# Patient Record
Sex: Female | Born: 1985 | Race: Black or African American | Hispanic: No | Marital: Single | Smoking: Never smoker
Health system: Southern US, Community
[De-identification: ages and names within clinical notes are randomized; demographics above are authoritative.]

## PROBLEM LIST (undated history)

## (undated) DIAGNOSIS — J45909 Unspecified asthma, uncomplicated: Secondary | ICD-10-CM

## (undated) DIAGNOSIS — J339 Nasal polyp, unspecified: Secondary | ICD-10-CM

## (undated) DIAGNOSIS — Z886 Allergy status to analgesic agent status: Secondary | ICD-10-CM

## (undated) DIAGNOSIS — N200 Calculus of kidney: Secondary | ICD-10-CM

## (undated) DIAGNOSIS — O039 Complete or unspecified spontaneous abortion without complication: Secondary | ICD-10-CM

## (undated) HISTORY — DX: Unspecified asthma, uncomplicated: J45.909

## (undated) HISTORY — DX: Allergy status to analgesic agent: Z88.6

## (undated) HISTORY — DX: Nasal polyp, unspecified: J33.9

## (undated) HISTORY — DX: Calculus of kidney: N20.0

## (undated) HISTORY — PX: NASAL ENDOSCOPY: SHX286

## (undated) HISTORY — DX: Complete or unspecified spontaneous abortion without complication: O03.9

## (undated) HISTORY — PX: NASAL SINUS SURGERY: SHX719

---

## 2017-07-26 DIAGNOSIS — Z888 Allergy status to other drugs, medicaments and biological substances status: Secondary | ICD-10-CM | POA: Insufficient documentation

## 2017-07-26 DIAGNOSIS — Z886 Allergy status to analgesic agent status: Secondary | ICD-10-CM | POA: Insufficient documentation

## 2017-12-11 ENCOUNTER — Encounter: Payer: Self-pay | Admitting: Emergency Medicine

## 2017-12-11 ENCOUNTER — Emergency Department: Payer: Managed Care, Other (non HMO)

## 2017-12-11 ENCOUNTER — Emergency Department
Admission: EM | Admit: 2017-12-11 | Discharge: 2017-12-12 | Disposition: A | Discharge status: Home / Self Care | Payer: Managed Care, Other (non HMO) | Attending: Emergency Medicine | Admitting: Emergency Medicine

## 2017-12-11 DIAGNOSIS — R200 Anesthesia of skin: Secondary | ICD-10-CM | POA: Diagnosis present

## 2017-12-11 DIAGNOSIS — G510 Bell's palsy: Secondary | ICD-10-CM | POA: Diagnosis not present

## 2017-12-11 DIAGNOSIS — Z79899 Other long term (current) drug therapy: Secondary | ICD-10-CM | POA: Insufficient documentation

## 2017-12-11 DIAGNOSIS — R29810 Facial weakness: Secondary | ICD-10-CM

## 2017-12-11 LAB — COMPREHENSIVE METABOLIC PANEL
ALK PHOS: 50 U/L (ref 38–126)
ALT: 10 U/L (ref 0–44)
AST: 16 U/L (ref 15–41)
Albumin: 4.5 g/dL (ref 3.5–5.0)
Anion gap: 6 (ref 5–15)
BUN: 10 mg/dL (ref 6–20)
CALCIUM: 9.2 mg/dL (ref 8.9–10.3)
CHLORIDE: 107 mmol/L (ref 98–111)
CO2: 25 mmol/L (ref 22–32)
CREATININE: 0.84 mg/dL (ref 0.44–1.00)
GFR calc Af Amer: >60 mL/min (ref >60)
Glucose, Bld: 92 mg/dL (ref 70–99)
Potassium: 3.7 mmol/L (ref 3.5–5.1)
Sodium: 138 mmol/L (ref 135–145)
Total Bilirubin: 1 mg/dL (ref 0.3–1.2)
Total Protein: 7.8 g/dL (ref 6.5–8.1)

## 2017-12-11 LAB — CBC
HEMATOCRIT: 42.2 % (ref 35.0–47.0)
Hemoglobin: 13.9 g/dL (ref 12.0–16.0)
MCH: 28.6 pg (ref 26.0–34.0)
MCHC: 32.9 g/dL (ref 32.0–36.0)
MCV: 86.8 fL (ref 80.0–100.0)
Platelets: 284 10*3/uL (ref 150–440)
RBC: 4.86 MIL/uL (ref 3.80–5.20)
RDW: 13.7 % (ref 11.5–14.5)
WBC: 9.4 10*3/uL (ref 3.6–11.0)

## 2017-12-11 LAB — DIFFERENTIAL
BASOS ABS: 0.1 10*3/uL (ref 0–0.1)
BASOS PCT: 1 %
Eosinophils Absolute: 0.6 10*3/uL (ref 0–0.7)
Eosinophils Relative: 7 %
LYMPHS PCT: 35 %
Lymphs Abs: 3.3 10*3/uL (ref 1.0–3.6)
MONO ABS: 0.8 10*3/uL (ref 0.2–0.9)
Monocytes Relative: 9 %
NEUTROS ABS: 4.6 10*3/uL (ref 1.4–6.5)
Neutrophils Relative %: 48 %

## 2017-12-11 LAB — TROPONIN I: Troponin I: <0.03 ng/mL (ref <0.03)

## 2017-12-11 LAB — PROTIME-INR
INR: 0.93
Prothrombin Time: 12.4 seconds (ref 11.4–15.2)

## 2017-12-11 LAB — APTT: APTT: 32 s (ref 24–36)

## 2017-12-11 NOTE — ED Notes (Signed)
Pt to CT via w/c accomp by CT tech 

## 2017-12-11 NOTE — ED Provider Notes (Signed)
[facility] Emergency Department Provider Note  ____________________________________________  Time seen: Approximately 9:09 PM  I have reviewed the triage vital signs and the nursing notes.   HISTORY  Chief Complaint Numbness   HPI Emily Wolf is a 32 y.o. female with a history of asthma who presents for evaluation of left sided facial droop.  Patient reports that she woke up at 645 this morning with her symptoms which have been constant.  She is complaining of left facial droop and inability to fully close her left eye.  She denies a headache.  She is also complaining of intermittent right upper extremity tingling.  She denies unilateral weakness or numbness, vertigo, headache, difficulty walking.  No personal or family history of stroke.  She does not smoke.  She does take Depo-Provera as birth control.  She denies any tick bites.  She denies any prior history of Bell's palsy.  PMH Asthma  Past Surgical History:  Procedure Laterality Date  . NASAL SINUS SURGERY      Prior to Admission medications   Not on File    Allergies Aspirin; Ibuprofen; and Augmentin [amoxicillin-pot clavulanate]  FH No history of stroke Diabetes Maternal Grandmother    Diabetes type II Maternal Grandmother    Asthma Mother    Diabetes type II Paternal Uncle    Asthma Sister       Social History Smoking - no Drugs - no Alcohol - yes, occasional  Review of Systems  Constitutional: Negative for fever. Eyes: Negative for visual changes. ENT: Negative for sore throat. Neck: No neck pain  Cardiovascular: Negative for chest pain. Respiratory: Negative for shortness of breath. Gastrointestinal: Negative for abdominal pain, vomiting or diarrhea. Genitourinary: Negative for dysuria. Musculoskeletal: Negative for back pain. Skin: Negative for rash. Neurological: Negative for headaches. + Left sided facial droop and RUE tingling Psych: No SI or  HI  ____________________________________________   PHYSICAL EXAM:  VITAL SIGNS: ED Triage Vitals  Enc Vitals Group     BP 12/11/17 1917 129/80     Pulse Rate 12/11/17 1917 88     Resp 12/11/17 1917 18     Temp 12/11/17 1917 98.5 F (36.9 C)     Temp Source 12/11/17 1917 Oral     SpO2 12/11/17 1917 100 %     Weight 12/11/17 1917 135 lb (61.2 kg)     Height 12/11/17 1917 4\' 9"  (1.448 m)     Head Circumference --      Peak Flow --      Pain Score 12/11/17 1922 0     Pain Loc --      Pain Edu? --      Excl. in [place]? --     Constitutional: Alert and oriented. Well appearing and in no apparent distress. HEENT:      Head: Normocephalic and atraumatic.         Eyes: Conjunctivae are normal. Sclera is non-icteric.       Mouth/Throat: Mucous membranes are moist.       Neck: Supple with no signs of meningismus. Cardiovascular: Regular rate and rhythm. No murmurs, gallops, or rubs. 2+ symmetrical distal pulses are present in all extremities. No JVD. Respiratory: Normal respiratory effort. Lungs are clear to auscultation bilaterally. No wheezes, crackles, or rhonchi.  Gastrointestinal: Soft, non tender, and non distended with positive bowel sounds. No rebound or guarding. Musculoskeletal: Nontender with normal range of motion in all extremities. No edema, cyanosis, or erythema of extremities. Neurologic: Normal  speech and language. A & O x3, PERRL, EOMI, no nystagmus, L sided facial droop, inability to fully close L eyelid but able to elevated the eyebrow on the left, motor testing reveals good tone and bulk throughout. There is no evidence of pronator drift or dysmetria. Muscle strength is 5/5 throughout. Sensory examination is intact. Gait is normal. Skin: Skin is warm, dry and intact. No rash noted. Psychiatric: Mood and affect are normal. Speech and behavior are normal.  ____________________________________________   LABS (all labs ordered are listed, but only abnormal results are  displayed)  Labs Reviewed  PROTIME-INR  APTT  CBC  DIFFERENTIAL  COMPREHENSIVE METABOLIC PANEL  TROPONIN I  CBG MONITORING, ED  POC URINE PREG, ED   ____________________________________________  EKG  ED ECG REPORT I, Rudene Re, the attending physician, personally viewed and interpreted this ECG.  Normal sinus rhythm, rate of 69, normal intervals, normal axis, no ST elevations or depressions.  Normal EKG. ____________________________________________  RADIOLOGY  I have personally reviewed the images performed during this visit and I agree with the Radiologist's read.   Interpretation by Radiologist:  Ct Head Wo Contrast  Result Date: 12/11/2017 CLINICAL DATA:  Left facial numbness and weakness today EXAM: CT HEAD WITHOUT CONTRAST TECHNIQUE: Contiguous axial images were obtained from the base of the skull through the vertex without intravenous contrast. COMPARISON:  None. FINDINGS: Brain: There is no mass effect, midline shift, or acute intracranial hemorrhage. Brain parenchyma and ventricular system are within normal limits. Vascular: No hyperdense vessel or unexpected calcification. Skull: Cranium is intact. Sinuses/Orbits: There is mucoid material layering in the sphenoid sinus. Postoperative changes in the paranasal sinuses are noted. There is mild mucosal thickening in the visualized maxillary sinuses and ethmoid air cells. Frontal sinuses are nearly completely opacified. Mastoid air cells are clear. Orbits are within normal limits. Other: Noncontributory IMPRESSION: No acute intracranial pathology. Inflammatory and postoperative changes in the paranasal sinuses are noted. Electronically Signed   By: Marybelle Killings M.D.   On: 12/11/2017 21:26      ____________________________________________   PROCEDURES  Procedure(s) performed: None Procedures Critical Care performed:  None ____________________________________________   INITIAL IMPRESSION / ASSESSMENT AND PLAN /  ED COURSE   32 y.o. female with a history of asthma who presents for evaluation of left sided facial droop since 6:45AM.  Patient has a mixed presentation for Bell's palsy versus CVA.  She is young and healthy otherwise but she is on birth control which could increase her risks slightly.  She has left-sided facial droop with inability to fully close her left eyelid but she is able to elevate her left forehead.  She has no other neurological deficits on exam.  Therefore due to this makes presentation we will do a head CT and MRI and if both are negative we will treat for Bell's palsy.  EKG showing no evidence of dysrhythmias.  Labs showing no evidence of hyperglycemia.  Clinical Course as of Dec 12 2322  Mon Dec 11, 2017  2323 CT head is negative.  MRI is pending.  Care transferred to Dr. Mable Paris.   [CV]    Clinical Course User Index [CV] Alfred Levins [place], MD     As part of my medical decision making, I reviewed the following data within the [place] notes reviewed and incorporated, Labs reviewed , EKG interpreted , Old chart reviewed, Radiograph reviewed , Notes from prior ED visits and [place] Controlled Substance Database    Pertinent labs & imaging  results that were available during my care of the patient were reviewed by me and considered in my medical decision making (see chart for details).    ____________________________________________   FINAL CLINICAL IMPRESSION(S) / ED DIAGNOSES  Final diagnoses:  Facial droop      NEW MEDICATIONS STARTED DURING THIS VISIT:  ED Discharge Orders    None       Note:  This document was prepared using Dragon voice recognition software and may include unintentional dictation errors.    Alfred Levins, [place], MD 12/11/17 [phone]

## 2017-12-11 NOTE — ED Notes (Signed)
Pt to MRI via w/c.

## 2017-12-11 NOTE — ED Triage Notes (Signed)
Patient coming in tonight saying that she feels like her right eye is blinking more than her right eye and her right side of her face her smile is different and has numbness and tingling in right upper arm. Patient states that symptoms started at 6:45am this morning.

## 2017-12-12 MED ORDER — VALACYCLOVIR HCL 1 G PO TABS: 2000.0000 mg | ORAL_TABLET | Freq: Three times a day (TID) | ORAL | 0 refills | Status: DC

## 2017-12-12 MED ORDER — VALACYCLOVIR HCL 1 G PO TABS: 1000.0000 mg | ORAL_TABLET | Freq: Three times a day (TID) | ORAL | 0 refills | Status: AC

## 2017-12-12 NOTE — ED Notes (Signed)
Pt uprite on stretcher in hallway with no distress noted; friend at bedside; pt reports since 645am has had intermittent numbness/tingling to right arm with left facial droop and diff closed left eye; pt denies any c/o pain; A&Ox3, MAEW, grips = & strong, speech clear

## 2017-12-12 NOTE — Discharge Instructions (Signed)
Please take your valacyclovir 3 times a day as prescribed for a full week and follow-up with the otolaryngologist within a week for recheck.  The most important thing to know about Bell's palsy is you are at risk for scratching your eye at night so please wear an eye patch or tape your eye shut at night.  It was a pleasure to take care of you today, and thank you for coming to our emergency department.  If you have any questions or concerns before leaving please ask the nurse to grab me and I'm more than happy to go through your aftercare instructions again.  If you were prescribed any opioid pain medication today such as Norco, Vicodin, Percocet, morphine, hydrocodone, or oxycodone please make sure you do not drive when you are taking this medication as it can alter your ability to drive safely.  If you have any concerns once you are home that you are not improving or are in fact getting worse before you can make it to your follow-up appointment, please do not hesitate to call [phone] and come back for further evaluation.  Darel Hong, MD  Results for orders placed or performed during the hospital encounter of 12/11/17  Protime-INR  Result Value Ref Range   Prothrombin Time 12.4 11.4 - 15.2 seconds   INR 0.93   APTT  Result Value Ref Range   aPTT 32 24 - 36 seconds  CBC  Result Value Ref Range   WBC 9.4 3.6 - 11.0 K/uL   RBC 4.86 3.80 - 5.20 MIL/uL   Hemoglobin 13.9 12.0 - 16.0 g/dL   HCT 42.2 35.0 - 47.0 %   MCV 86.8 80.0 - 100.0 fL   MCH 28.6 26.0 - 34.0 pg   MCHC 32.9 32.0 - 36.0 g/dL   RDW 13.7 11.5 - 14.5 %   Platelets 284 150 - 440 K/uL  Differential  Result Value Ref Range   Neutrophils Relative % 48 %   Neutro Abs 4.6 1.4 - 6.5 K/uL   Lymphocytes Relative 35 %   Lymphs Abs 3.3 1.0 - 3.6 K/uL   Monocytes Relative 9 %   Monocytes Absolute 0.8 0.2 - 0.9 K/uL   Eosinophils Relative 7 %   Eosinophils Absolute 0.6 0 - 0.7 K/uL   Basophils Relative 1 %   Basophils Absolute  0.1 0 - 0.1 K/uL  Comprehensive metabolic panel  Result Value Ref Range   Sodium 138 135 - 145 mmol/L   Potassium 3.7 3.5 - 5.1 mmol/L   Chloride 107 98 - 111 mmol/L   CO2 25 22 - 32 mmol/L   Glucose, Bld 92 70 - 99 mg/dL   BUN 10 6 - 20 mg/dL   Creatinine, Ser 0.84 0.44 - 1.00 mg/dL   Calcium 9.2 8.9 - 10.3 mg/dL   Total Protein 7.8 6.5 - 8.1 g/dL   Albumin 4.5 3.5 - 5.0 g/dL   AST 16 15 - 41 U/L   ALT 10 0 - 44 U/L   Alkaline Phosphatase 50 38 - 126 U/L   Total Bilirubin 1.0 0.3 - 1.2 mg/dL   GFR calc non Af Amer >60 >60 mL/min   GFR calc Af Amer >60 >60 mL/min   Anion gap 6 5 - 15  Troponin I  Result Value Ref Range   Troponin I <0.03 <0.03 ng/mL   Ct Head Wo Contrast  Result Date: 12/11/2017 CLINICAL DATA:  Left facial numbness and weakness today EXAM: CT HEAD WITHOUT CONTRAST TECHNIQUE:  Contiguous axial images were obtained from the base of the skull through the vertex without intravenous contrast. COMPARISON:  None. FINDINGS: Brain: There is no mass effect, midline shift, or acute intracranial hemorrhage. Brain parenchyma and ventricular system are within normal limits. Vascular: No hyperdense vessel or unexpected calcification. Skull: Cranium is intact. Sinuses/Orbits: There is mucoid material layering in the sphenoid sinus. Postoperative changes in the paranasal sinuses are noted. There is mild mucosal thickening in the visualized maxillary sinuses and ethmoid air cells. Frontal sinuses are nearly completely opacified. Mastoid air cells are clear. Orbits are within normal limits. Other: Noncontributory IMPRESSION: No acute intracranial pathology. Inflammatory and postoperative changes in the paranasal sinuses are noted. Electronically Signed   By: Marybelle Killings M.D.   On: 12/11/2017 21:26   Mr Brain Wo Contrast  Result Date: 12/11/2017 CLINICAL DATA:  32 y/o F; numbness and tingling in the right upper extremity and abnormal right eye blinking. EXAM: MRI HEAD WITHOUT CONTRAST  TECHNIQUE: Multiplanar, multiecho pulse sequences of the brain and surrounding structures were obtained without intravenous contrast. COMPARISON:  12/11/2016 CT head. FINDINGS: Brain: No acute infarction, hemorrhage, hydrocephalus, extra-axial collection or mass lesion. No structural or signal abnormality of the brain. Vascular: Normal flow voids. Skull and upper cervical spine: Normal marrow signal. Sinuses/Orbits: Bilateral Ethmoidectomy and maxillary antrostomy. Mucosal thickening in the left maxillary sinus and partial opacification of the sphenoid and frontal sinuses. Normal aeration of mastoid air cells. Orbits are unremarkable. Other: None. IMPRESSION: 1. No acute intracranial abnormality. Unremarkable MRI of the brain. 2. Bilateral Ethmoidectomy and maxillary antrostomy. Mucosal thickening in the left maxillary sinus and partial opacification of the sphenoid and frontal sinuses. Electronically Signed   By: Kristine Garbe M.D.   On: 12/11/2017 23:51

## 2017-12-12 NOTE — ED Provider Notes (Signed)
MRI negative.  DC as Bell's polyethylene according to Dr. Loman [place] plan.   Darel Hong, MD 12/12/17 0002

## 2019-02-09 IMAGING — CT CT HEAD W/O CM
3 series · 16 of 45 positions shown, 19 images · non-contrast
Comparison: None.

CLINICAL DATA: Left facial numbness and weakness today

EXAM:
CT HEAD WITHOUT CONTRAST
TECHNIQUE: Contiguous axial images were obtained from the base of the skull
through the vertex without intravenous contrast.

[Series 2: head wo · axial · 0.47mm/px · z∈[-139,-24]mm · 10 of 28 slices shown, 13 images]
[im 3/28  brain]
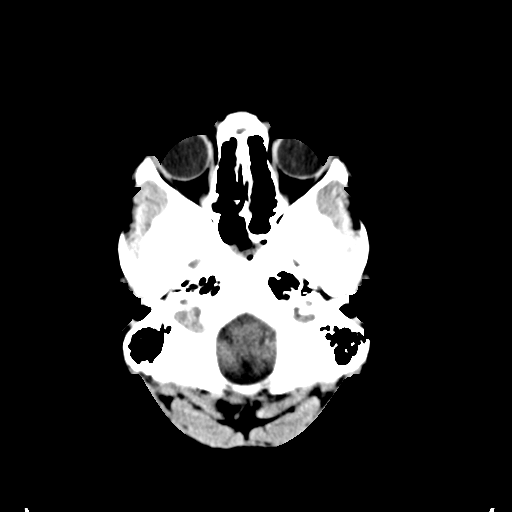
[im 3/28  bone]
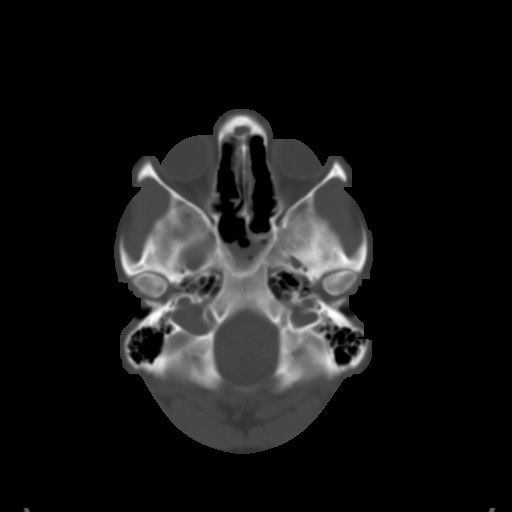
[im 5/28  brain]
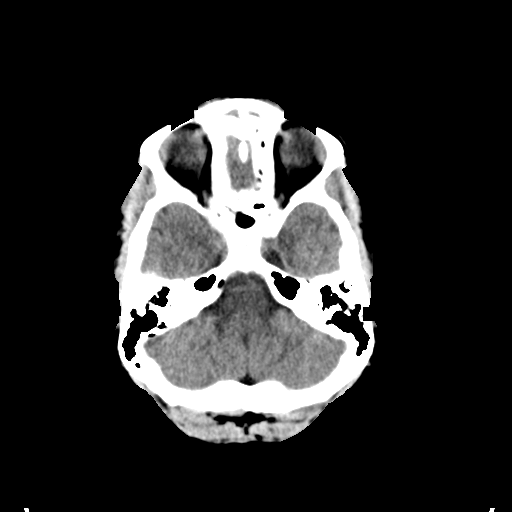
[im 8/28  brain]
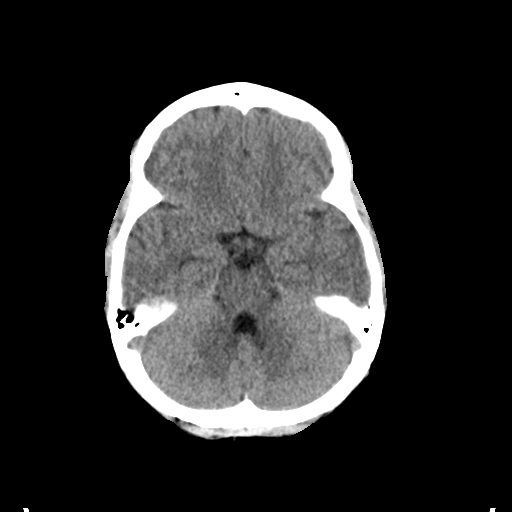
[im 11/28  brain]
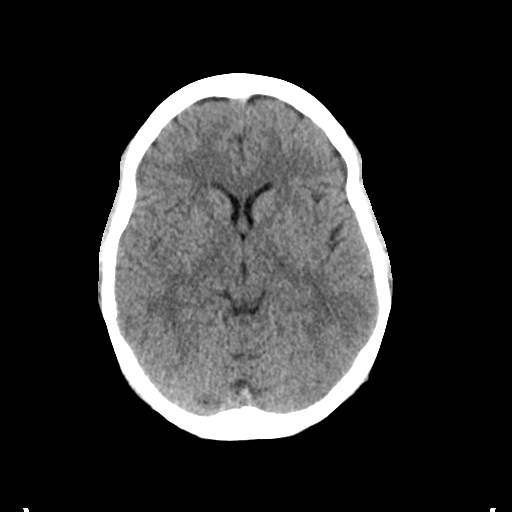
[im 13/28  brain]
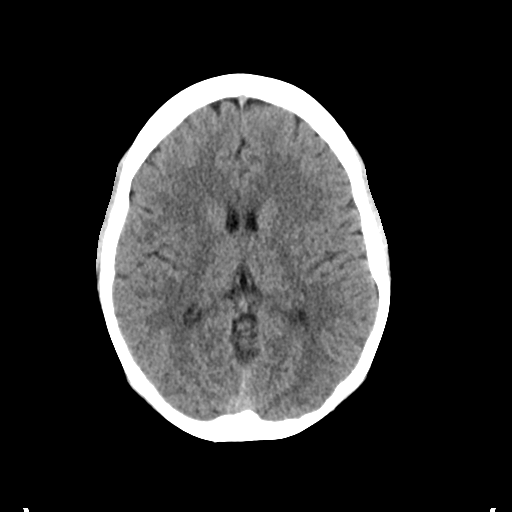
[im 13/28  bone]
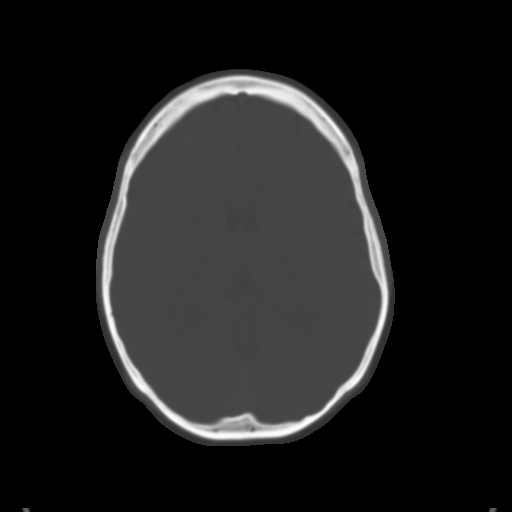
[im 16/28  brain]
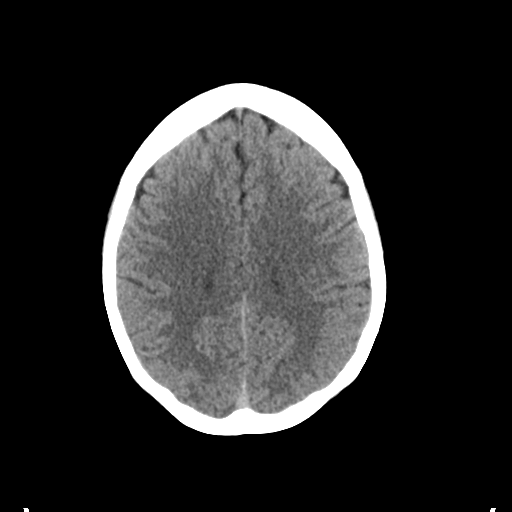
[im 18/28  brain]
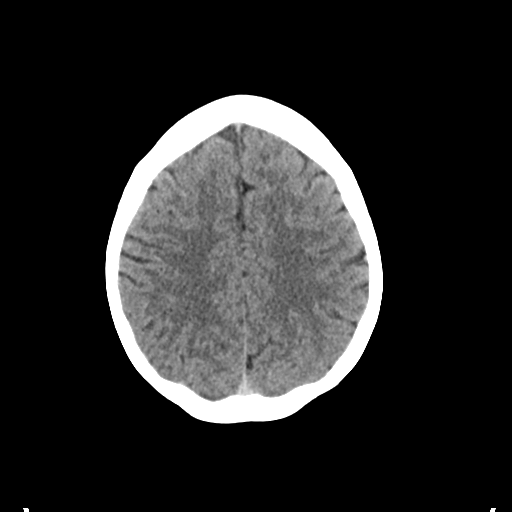
[im 21/28  brain]
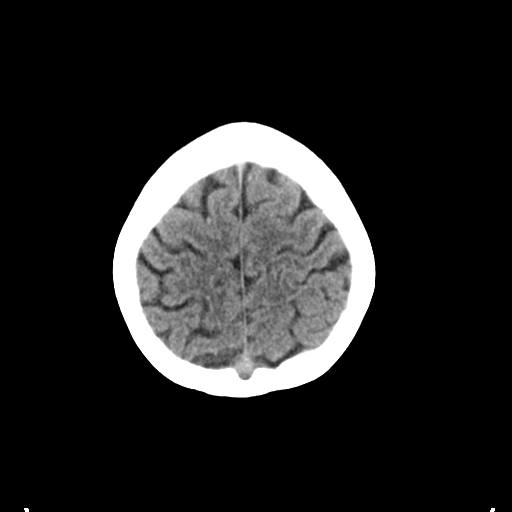
[im 24/28  brain]
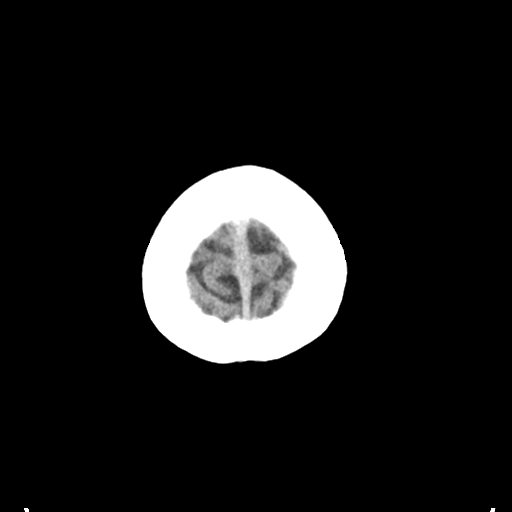
[im 24/28  bone]
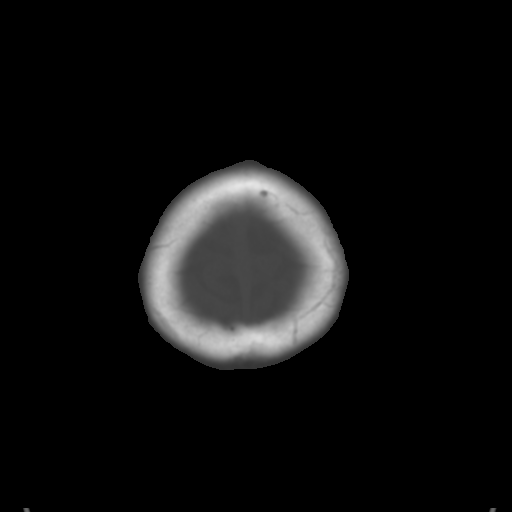
[im 26/28  brain]
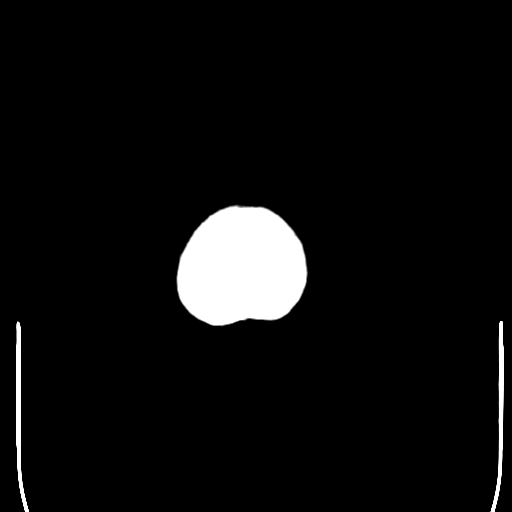

[Series 4: coronal soft tissue · coronal · 0.28mm/px · 3 of 59 slices shown]
[im 20/59  brain]
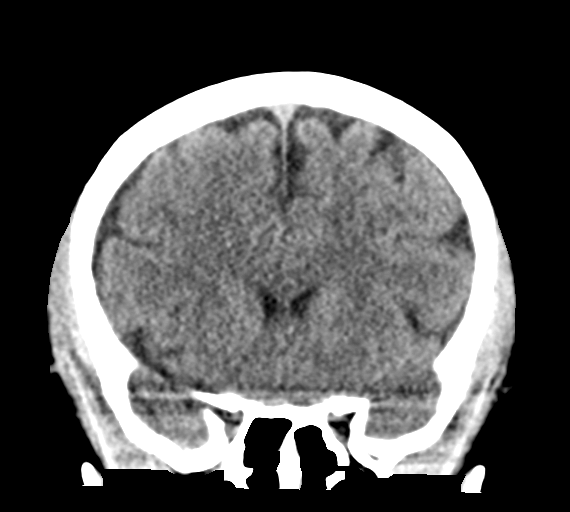
[im 26/59  brain]
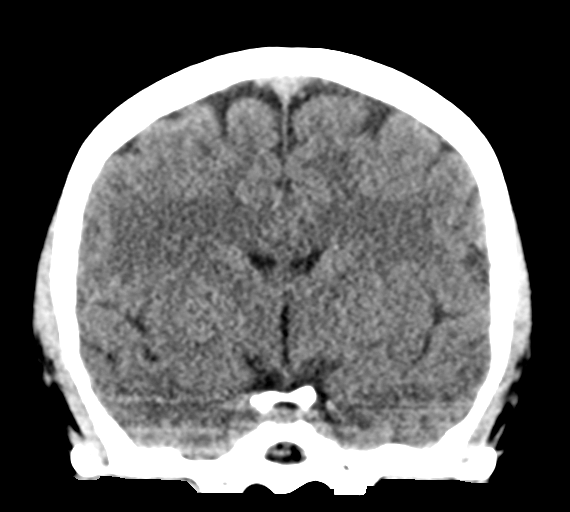
[im 33/59  brain]
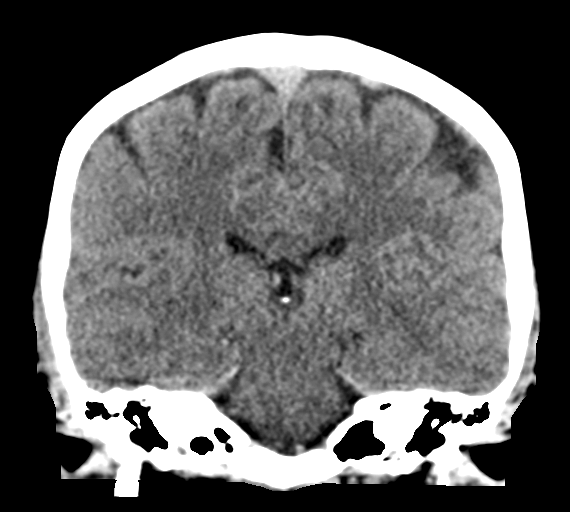

[Series 5: sagittal soft tissue · sagittal · 0.29mm/px · 3 of 53 slices shown]
[im 18/53  brain]
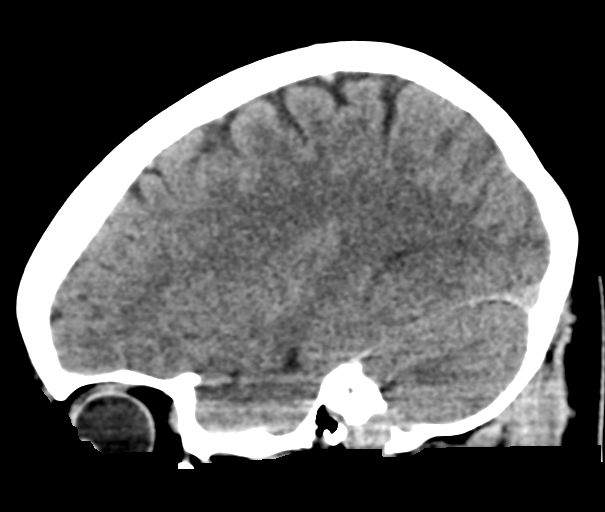
[im 27/53  brain]
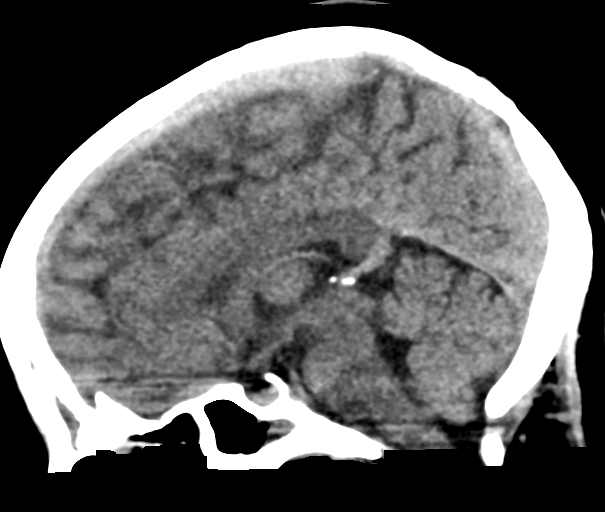
[im 35/53  brain]
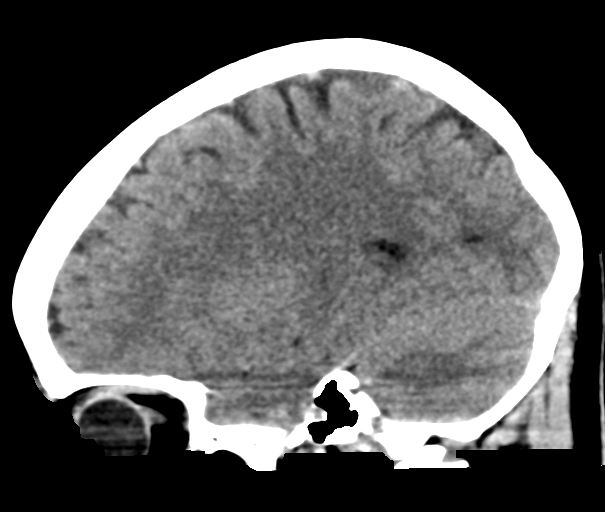

[16 of 45 positions shown; findings below may reference images not displayed]

FINDINGS: Brain: There is no mass effect, midline shift, or acute intracranial
hemorrhage. Brain parenchyma and ventricular system are within
normal limits.

Vascular: No hyperdense vessel or unexpected calcification.

Skull: Cranium is intact.

Sinuses/Orbits: There is mucoid material layering in the sphenoid
sinus. Postoperative changes in the paranasal sinuses are noted.
There is mild mucosal thickening in the visualized maxillary sinuses
and ethmoid air cells. Frontal sinuses are nearly completely
opacified. Mastoid air cells are clear. Orbits are within normal
limits.

Other: Noncontributory
IMPRESSION: No acute intracranial pathology.

Inflammatory and postoperative changes in the paranasal sinuses are
noted.

## 2019-04-14 DIAGNOSIS — J309 Allergic rhinitis, unspecified: Secondary | ICD-10-CM | POA: Insufficient documentation

## 2020-05-19 ENCOUNTER — Emergency Department: Payer: Managed Care, Other (non HMO)

## 2020-05-19 ENCOUNTER — Encounter: Payer: Self-pay | Admitting: Emergency Medicine

## 2020-05-19 ENCOUNTER — Other Ambulatory Visit: Payer: Self-pay

## 2020-05-19 ENCOUNTER — Emergency Department
Admission: EM | Admit: 2020-05-19 | Discharge: 2020-05-19 | Disposition: A | Discharge status: Home / Self Care | Payer: Managed Care, Other (non HMO) | Attending: Emergency Medicine | Admitting: Emergency Medicine

## 2020-05-19 DIAGNOSIS — R1031 Right lower quadrant pain: Secondary | ICD-10-CM | POA: Diagnosis present

## 2020-05-19 DIAGNOSIS — Z20822 Contact with and (suspected) exposure to covid-19: Secondary | ICD-10-CM | POA: Insufficient documentation

## 2020-05-19 DIAGNOSIS — N202 Calculus of kidney with calculus of ureter: Secondary | ICD-10-CM | POA: Diagnosis not present

## 2020-05-19 DIAGNOSIS — N200 Calculus of kidney: Secondary | ICD-10-CM

## 2020-05-19 DIAGNOSIS — N281 Cyst of kidney, acquired: Secondary | ICD-10-CM

## 2020-05-19 LAB — URINALYSIS, COMPLETE (UACMP) WITH MICROSCOPIC
Bilirubin Urine: NEGATIVE
Glucose, UA: NEGATIVE mg/dL
Ketones, ur: 5 mg/dL — AB
Leukocytes,Ua: NEGATIVE
Nitrite: NEGATIVE
Protein, ur: 100 mg/dL — AB
RBC / HPF: >50 RBC/hpf — ABNORMAL HIGH (ref 0–5)
Specific Gravity, Urine: 1.03 (ref 1.005–1.030)
pH: 6 (ref 5.0–8.0)

## 2020-05-19 LAB — COMPREHENSIVE METABOLIC PANEL
ALT: 22 U/L (ref 0–44)
AST: 22 U/L (ref 15–41)
Albumin: 4.1 g/dL (ref 3.5–5.0)
Alkaline Phosphatase: 55 U/L (ref 38–126)
Anion gap: 10 (ref 5–15)
BUN: 12 mg/dL (ref 6–20)
CO2: 23 mmol/L (ref 22–32)
Calcium: 8.8 mg/dL — ABNORMAL LOW (ref 8.9–10.3)
Chloride: 106 mmol/L (ref 98–111)
Creatinine, Ser: 0.92 mg/dL (ref 0.44–1.00)
GFR, Estimated: >60 mL/min (ref >60)
Glucose, Bld: 122 mg/dL — ABNORMAL HIGH (ref 70–99)
Potassium: 3.4 mmol/L — ABNORMAL LOW (ref 3.5–5.1)
Sodium: 139 mmol/L (ref 135–145)
Total Bilirubin: 1.2 mg/dL (ref 0.3–1.2)
Total Protein: 7.3 g/dL (ref 6.5–8.1)

## 2020-05-19 LAB — CBC
HCT: 37.8 % (ref 36.0–46.0)
Hemoglobin: 11.7 g/dL — ABNORMAL LOW (ref 12.0–15.0)
MCH: 27.7 pg (ref 26.0–34.0)
MCHC: 31 g/dL (ref 30.0–36.0)
MCV: 89.4 fL (ref 80.0–100.0)
Platelets: 294 10*3/uL (ref 150–400)
RBC: 4.23 MIL/uL (ref 3.87–5.11)
RDW: 12.8 % (ref 11.5–15.5)
WBC: 13.7 10*3/uL — ABNORMAL HIGH (ref 4.0–10.5)
nRBC: 0 % (ref 0.0–0.2)

## 2020-05-19 LAB — RESP PANEL BY RT-PCR (FLU A&B, COVID) ARPGX2
Influenza A by PCR: NEGATIVE
Influenza B by PCR: NEGATIVE
SARS Coronavirus 2 by RT PCR: NEGATIVE

## 2020-05-19 LAB — POC URINE PREG, ED: Preg Test, Ur: NEGATIVE

## 2020-05-19 LAB — LIPASE, BLOOD: Lipase: 42 U/L (ref 11–51)

## 2020-05-19 MED ORDER — OXYCODONE-ACETAMINOPHEN 5-325 MG PO TABS: 1.0000 | ORAL_TABLET | ORAL | 0 refills | Status: DC | PRN

## 2020-05-19 MED ORDER — SODIUM CHLORIDE 0.9 % IV BOLUS
1000.0000 mL | Freq: Once | INTRAVENOUS | Status: AC
  Administered 2020-05-19: 1000 mL via INTRAVENOUS

## 2020-05-19 MED ORDER — TAMSULOSIN HCL 0.4 MG PO CAPS: 0.4000 mg | ORAL_CAPSULE | Freq: Every day | ORAL | 0 refills | Status: DC

## 2020-05-19 MED ORDER — HYDROMORPHONE HCL 1 MG/ML IJ SOLN
1.0000 mg | Freq: Once | INTRAMUSCULAR | Status: AC
Start: 2020-05-19 — End: 2020-05-19

## 2020-05-19 MED ORDER — IOHEXOL 300 MG/ML  SOLN
75.0000 mL | Freq: Once | INTRAMUSCULAR | Status: AC | PRN
  Administered 2020-05-19: 75 mL via INTRAVENOUS
  Filled 2020-05-19: qty 75

## 2020-05-19 MED ORDER — ONDANSETRON HCL 4 MG/2ML IJ SOLN
4.0000 mg | Freq: Once | INTRAMUSCULAR | Status: AC
Start: 2020-05-19 — End: 2020-05-19
  Administered 2020-05-19: 4 mg via INTRAVENOUS
  Filled 2020-05-19: qty 2

## 2020-05-19 MED ORDER — HYDROMORPHONE HCL 1 MG/ML IJ SOLN
INTRAMUSCULAR | Status: AC
  Administered 2020-05-19: 1 mg via INTRAVENOUS
  Filled 2020-05-19: qty 1

## 2020-05-19 MED ORDER — MORPHINE SULFATE (PF) 4 MG/ML IV SOLN
4.0000 mg | Freq: Once | INTRAVENOUS | Status: AC
  Administered 2020-05-19: 4 mg via INTRAVENOUS
  Filled 2020-05-19: qty 1

## 2020-05-19 MED ORDER — ONDANSETRON 4 MG PO TBDP: 4.0000 mg | ORAL_TABLET | Freq: Once | ORAL | Status: DC | PRN

## 2020-05-19 MED ORDER — OXYCODONE-ACETAMINOPHEN 5-325 MG PO TABS
1.0000 | ORAL_TABLET | Freq: Once | ORAL | Status: AC
Start: 2020-05-19 — End: 2020-05-19
  Administered 2020-05-19: 1 via ORAL
  Filled 2020-05-19: qty 1

## 2020-05-19 NOTE — ED Notes (Addendum)
Pt states she is unable to use the restroom at this time. Call bell within reach. Instructed pt to press call light when she is able to use the restroom.

## 2020-05-19 NOTE — ED Notes (Signed)
Pt alert and oriented X 4, stable for discharge. RR even and unlabored, color WNL. Discussed discharge instructions and follow-up as directed. Discharge medications discussed if prescribed. Pt had opportunity to ask questions, and RN to provide patient/family eduction.  

## 2020-05-19 NOTE — ED Triage Notes (Addendum)
Pt in via [facility] with sharp, stabbing R flank pain that woke her at 0600 this am. Endorses nausea. Denies any cp or sob. Pain 10/10. Denies any urinary symptoms. Pain radiates to RLQ

## 2020-05-19 NOTE — Discharge Instructions (Signed)
You have a 3 mm kidney stone in the right ureter, you have a cyst on your kidney on the left side. Follow-up with urology to address both these issues Drink plenty of water and add lemon Take the Percocet as needed for pain, Flomax daily to help you urinate better to pass the stone

## 2020-05-19 NOTE — ED Provider Notes (Signed)
[facility] Emergency Department Provider Note  ____________________________________________   Event Date/Time   First MD Initiated Contact with Patient 05/19/20 1158     (approximate)  I have reviewed the triage vital signs and the nursing notes.   HISTORY  Chief Complaint Flank Pain    HPI Emily Wolf is a 35 y.o. female presents emergency department with severe right lower quadrant pain.  Patient states that the pain radiates into her back.  States pain started at 6 AM this morning.  Some nausea.  No actual vomiting.  No chest pain or shortness of breath.  No history of ovarian cyst.  Patient does still have her appendix.  Remainder the review of systems is limited at this time due to the patient being in so much pain that she is rocking back and forth and cannot answer my questions.    History reviewed. No pertinent past medical history.  There are no problems to display for this patient.   Past Surgical History:  Procedure Laterality Date  . NASAL SINUS SURGERY      Prior to Admission medications   Medication Sig Start Date End Date Taking? Authorizing Provider  albuterol (VENTOLIN HFA) 108 (90 Base) MCG/ACT inhaler Inhale 2 puffs into the lungs every 6 (six) hours as needed for wheezing or shortness of breath.   Yes [provider]  Dupilumab (DUPIXENT) 100 MG/0.67ML SOSY Inject into the skin.   Yes [provider]  Fluticasone-Salmeterol (ADVAIR) 250-50 MCG/DOSE AEPB Inhale 1 puff into the lungs 2 (two) times daily.   Yes [provider]  oxyCODONE-acetaminophen (PERCOCET) 5-325 MG tablet Take 1 tablet by mouth every 4 (four) hours as needed for severe pain. 05/19/20 05/19/21 Yes Rachella Basden, Linden Dolin, PA-C  tamsulosin (FLOMAX) 0.4 MG CAPS capsule Take 1 capsule (0.4 mg total) by mouth daily. 05/19/20  Yes Mona Ayars, Linden Dolin, PA-C    Allergies Aspirin, Ibuprofen, and Augmentin [amoxicillin-pot clavulanate]  No family  history on file.  Social History Social History   Tobacco Use  . Smoking status: Never Smoker  . Smokeless tobacco: Never Used  Substance Use Topics  . Alcohol use: Yes    Comment: occasional    Review of Systems  Constitutional: No fever/chills Eyes: No visual changes. ENT: No sore throat. Respiratory: Denies cough Cardiovascular: Denies chest pain Gastrointestinal: Positive abdominal pain Genitourinary: Negative for dysuria. Musculoskeletal: Negative for back pain. Skin: Negative for rash. Psychiatric: no mood changes,     ____________________________________________   PHYSICAL EXAM:  VITAL SIGNS: ED Triage Vitals  Enc Vitals Group     BP 05/19/20 0922 103/61     Pulse Rate 05/19/20 0922 78     Resp 05/19/20 0922 18     Temp 05/19/20 0922 98.5 F (36.9 C)     Temp Source 05/19/20 0922 Oral     SpO2 05/19/20 0922 100 %     Weight 05/19/20 0923 119 lb (54 kg)     Height --      Head Circumference --      Peak Flow --      Pain Score --      Pain Loc --      Pain Edu? --      Excl. in [place]? --     Constitutional: Alert and oriented. Well appearing and in no acute distress. Eyes: Conjunctivae are normal.  Head: Atraumatic. Nose: No congestion/rhinnorhea. Mouth/Throat: Mucous membranes are moist.   Neck:  supple no lymphadenopathy noted Cardiovascular:  Normal rate, regular rhythm. Heart sounds are normal Respiratory: Normal respiratory effort.  No retractions, lungs c t a  Abd: soft Tender in the right upper and right lower quadrant, bs normal all 4 quad GU: deferred Musculoskeletal: FROM all extremities, warm and well perfused Neurologic:  Normal speech and language.  Skin:  Skin is warm, dry and intact. No rash noted. Psychiatric: Mood and affect are normal. Speech and behavior are normal.  ____________________________________________   LABS (all labs ordered are listed, but only abnormal results are displayed)  Labs Reviewed  COMPREHENSIVE  METABOLIC PANEL - Abnormal; Notable for the following components:      Result Value   Potassium 3.4 (*)    Glucose, Bld 122 (*)    Calcium 8.8 (*)    All other components within normal limits  CBC - Abnormal; Notable for the following components:   WBC 13.7 (*)    Hemoglobin 11.7 (*)    All other components within normal limits  URINALYSIS, COMPLETE (UACMP) WITH MICROSCOPIC - Abnormal; Notable for the following components:   Color, Urine AMBER (*)    APPearance CLOUDY (*)    Hgb urine dipstick LARGE (*)    Ketones, ur 5 (*)    Protein, ur 100 (*)    RBC / HPF >50 (*)    Bacteria, UA FEW (*)    All other components within normal limits  RESP PANEL BY RT-PCR (FLU A&B, COVID) ARPGX2  LIPASE, BLOOD  POC URINE PREG, ED   ____________________________________________   ____________________________________________  RADIOLOGY  CT abdomen/pelvis with IV contrast  ____________________________________________   PROCEDURES  Procedure(s) performed: No  Procedures    ____________________________________________   INITIAL IMPRESSION / ASSESSMENT AND PLAN / ED COURSE  Pertinent labs & imaging results that were available during my care of the patient were reviewed by me and considered in my medical decision making (see chart for details).   Patient is a 35 year old female presents with right-sided flank pain that radiates into her back.  See HPI.  Physical exam shows patient to appear very uncomfortable.  Vitals are normal.  Abdomen is tender along the right upper quadrant and right lower quadrant  DDx: Acute cholecystitis, acute appendicitis, kidney stone, ovarian cyst and torsion  CBC has elevated WBC of 13.7 which is concerning for appendicitis or acute cholecystitis, comprehensive metabolic panel is normal, lipase normal, resp panel is negative  Ct shows 32mm partially obstructing kidney stone, reviewed by me and confirmed by radiology, pt also has a cyst on left  kidney  Advised pt of results, she is much more comfortable on reexamination, she is to f/u with urology, return to ER if worsening, given rx for percocet and flomax, she was discharged in stable condition      Emily Wolf was evaluated in Emergency Department on 05/19/2020 for the symptoms described in the history of present illness. She was evaluated in the context of the global COVID-19 pandemic, which necessitated consideration that the patient might be at risk for infection with the SARS-CoV-2 virus that causes COVID-19. Institutional protocols and algorithms that pertain to the evaluation of patients at risk for COVID-19 are in a state of rapid change based on information released by regulatory bodies including the CDC and federal and state organizations. These policies and algorithms were followed during the patient's care in the ED.    As part of my medical decision making, I reviewed the following data within the [place] notes reviewed and incorporated,  Labs reviewed , Old chart reviewed, Radiograph reviewed , Notes from prior ED visits and [place] Controlled Substance Database  ____________________________________________   FINAL CLINICAL IMPRESSION(S) / ED DIAGNOSES  Final diagnoses:  Kidney stone  Cyst of left kidney      NEW MEDICATIONS STARTED DURING THIS VISIT:  New Prescriptions   OXYCODONE-ACETAMINOPHEN (PERCOCET) 5-325 MG TABLET    Take 1 tablet by mouth every 4 (four) hours as needed for severe pain.   TAMSULOSIN (FLOMAX) 0.4 MG CAPS CAPSULE    Take 1 capsule (0.4 mg total) by mouth daily.     Note:  This document was prepared using Dragon voice recognition software and may include unintentional dictation errors.    Versie Starks, PA-C 05/19/20 1518    Arta Silence, MD 05/19/20 1530

## 2020-05-21 ENCOUNTER — Other Ambulatory Visit: Payer: Self-pay | Admitting: Radiology

## 2020-05-21 ENCOUNTER — Ambulatory Visit (INDEPENDENT_AMBULATORY_CARE_PROVIDER_SITE_OTHER): Payer: Managed Care, Other (non HMO) | Admitting: Urology

## 2020-05-21 ENCOUNTER — Other Ambulatory Visit: Payer: Self-pay

## 2020-05-21 ENCOUNTER — Encounter: Payer: Self-pay | Admitting: Urology

## 2020-05-21 VITALS — BP 114/87 | HR 73 | Ht <= 58 in

## 2020-05-21 DIAGNOSIS — R110 Nausea: Secondary | ICD-10-CM | POA: Diagnosis not present

## 2020-05-21 DIAGNOSIS — N201 Calculus of ureter: Secondary | ICD-10-CM

## 2020-05-21 DIAGNOSIS — R109 Unspecified abdominal pain: Secondary | ICD-10-CM

## 2020-05-21 LAB — URINALYSIS, COMPLETE
Bilirubin, UA: NEGATIVE
Glucose, UA: NEGATIVE
Leukocytes,UA: NEGATIVE
Nitrite, UA: NEGATIVE
Specific Gravity, UA: 1.025 (ref 1.005–1.030)
Urobilinogen, Ur: 0.2 mg/dL (ref 0.2–1.0)
pH, UA: 6 (ref 5.0–7.5)

## 2020-05-21 LAB — MICROSCOPIC EXAMINATION

## 2020-05-21 MED ORDER — HYDROCODONE-ACETAMINOPHEN 5-325 MG PO TABS: 1.0000 | ORAL_TABLET | Freq: Four times a day (QID) | ORAL | 0 refills | Status: DC | PRN

## 2020-05-21 MED ORDER — ONDANSETRON HCL 4 MG PO TABS: 4.0000 mg | ORAL_TABLET | Freq: Three times a day (TID) | ORAL | 0 refills | Status: DC | PRN

## 2020-05-21 NOTE — Progress Notes (Signed)
05/21/2020 11:35 AM   Emily Wolf 06-02-1985 [id number]  Referring provider: Michaell Cowing, MD [address],  [place] [address]  Chief Complaint  Patient presents with  . Nephrolithiasis    HPI: 35 year old female who presents today for further evaluation of ureteral calculus.  She was seen and evaluated on 05/19/2020 in the emergency room with acute onset right flank pain.  CT of the abdomen pelvis indicated a 3 mm right proximal ureteral calculus with mild pelviectasis and a delayed nephrogram on the side.  No other additional upper tract stones.  Hounsfield units 730.  Stone skin distance 12.5.  She is never had a previous stone episode.  She continues to have severe right flank pain.  She appears to be in discomfort today.  She is accompanied by her boyfriend.  She has been taking Percocet but has nausea every time she takes this pill.  She does report subjective hot flashes but is unsure whether or not these are fevers.  She denies any dysuria.  She does report that her pain is moved more from her right flank down to her right mid abdomen.  Urinalysis today is unremarkable.  PMH: No past medical history on file.  Surgical History: Past Surgical History:  Procedure Laterality Date  . NASAL SINUS SURGERY      Home Medications:  Allergies as of 05/21/2020      Reactions   Aspirin Shortness Of Breath   Ibuprofen Shortness Of Breath   Augmentin [amoxicillin-pot Clavulanate] Hives      Medication List       Accurate as of May 21, 2020 11:35 AM. If you have any questions, ask your nurse or doctor.        albuterol 108 (90 Base) MCG/ACT inhaler Commonly known as: VENTOLIN HFA Inhale 2 puffs into the lungs every 6 (six) hours as needed for wheezing or shortness of breath.   Dupixent 100 MG/0.67ML Sosy Generic drug: Dupilumab Inject into the skin.   Fluticasone-Salmeterol 250-50 MCG/DOSE Aepb Commonly known as:  ADVAIR Inhale 1 puff into the lungs 2 (two) times daily.   HYDROcodone-acetaminophen 5-325 MG tablet Commonly known as: NORCO/VICODIN Take 1-2 tablets by mouth every 6 (six) hours as needed for moderate pain. Started by: [name], MD   ondansetron 4 MG tablet Commonly known as: Zofran Take 1 tablet (4 mg total) by mouth every 8 (eight) hours as needed for nausea or vomiting. Started by: [name], MD   oxyCODONE-acetaminophen 5-325 MG tablet Commonly known as: Percocet Take 1 tablet by mouth every 4 (four) hours as needed for severe pain.   tamsulosin 0.4 MG Caps capsule Commonly known as: FLOMAX Take 1 capsule (0.4 mg total) by mouth daily.       Allergies:  Allergies  Allergen Reactions  . Aspirin Shortness Of Breath  . Ibuprofen Shortness Of Breath  . Augmentin [Amoxicillin-Pot Clavulanate] Hives    Family History: No family history on file.  Social History:  reports that she has never smoked. She has never used smokeless tobacco. She reports current alcohol use. No history on file for drug use.   Physical Exam: BP 114/87   Pulse 73   Ht 4\' 9"  (1.448 m)   LMP 05/12/2020 Comment: neg preg test 05/19/20  BMI 25.76 kg/m   Constitutional: In wheelchair, appears uncomfortable.  Accompanied by boyfriend. HEENT: [place] AT, moist mucus membranes.  Trachea midline, no masses. Cardiovascular: No clubbing, cyanosis, or edema. Respiratory: Normal respiratory  effort, no increased work of breathing. GI: Abdomen is soft, nontender, nondistended, no abdominal masses Skin: No rashes, bruises or suspicious lesions. Neurologic: Grossly intact, no focal deficits, moving all 4 extremities. Psychiatric: Normal mood and affect.  Laboratory Data: Lab Results  Component Value Date   WBC 13.7 (H) 05/19/2020   HGB 11.7 (L) 05/19/2020   HCT 37.8 05/19/2020   MCV 89.4 05/19/2020   PLT 294 05/19/2020    Lab Results  Component Value Date   CREATININE 0.92 05/19/2020     Urinalysis    Component Value Date/Time   COLORURINE AMBER (A) 05/19/2020 1224   APPEARANCEUR CLOUDY (A) 05/19/2020 1224   LABSPEC 1.030 05/19/2020 1224   PHURINE 6.0 05/19/2020 1224   GLUCOSEU NEGATIVE 05/19/2020 1224   HGBUR LARGE (A) 05/19/2020 1224   BILIRUBINUR NEGATIVE 05/19/2020 1224   KETONESUR 5 (A) 05/19/2020 1224   PROTEINUR 100 (A) 05/19/2020 1224   NITRITE NEGATIVE 05/19/2020 1224   LEUKOCYTESUR NEGATIVE 05/19/2020 1224    Lab Results  Component Value Date   BACTERIA FEW (A) 05/19/2020    Pertinent Imaging: Narrative & Impression  CLINICAL DATA:  Abdominal pain.  Acute RIGHT plane with dark urine.  EXAM: CT ABDOMEN AND PELVIS WITH CONTRAST  TECHNIQUE: Multidetector CT imaging of the abdomen and pelvis was performed using the standard protocol following bolus administration of intravenous contrast.  CONTRAST:  63mL OMNIPAQUE IOHEXOL 300 MG/ML  SOLN  COMPARISON:  None.  FINDINGS: Lower chest: Lung bases are clear.  Hepatobiliary: No focal hepatic lesion. No biliary duct dilatation. Common bile duct is normal.  Pancreas: Pancreas is normal. No ductal dilatation. No pancreatic inflammation.  Spleen: Normal spleen  Adrenals/urinary tract: Adrenal glands normal. Partially obstructing calculus in the proximal RIGHT ureter measures 3 mm on image 39/2. This calculus is just beyond the ureteropelvic junction (coronal image 33/5). Mild pelvicaliectasis and delayed renal enhancement on the RIGHT.  Low-density cyst within the LEFT kidney measures 13 mm in the cortex. No LEFT ureteral lithiasis.  No bladder calculi, enhancing bladder lesions, or filling defect within the bladder.  Stomach/Bowel: Stomach, small bowel, appendix, and cecum are normal. The colon and rectosigmoid colon are normal.  Vascular/Lymphatic: Abdominal aorta is normal caliber. No periportal or retroperitoneal adenopathy. No pelvic adenopathy.  Reproductive:  Uterus and adnexa unremarkable.  Other: Small volume free fluid in the pelvis is favored physiologic.  Musculoskeletal: No aggressive osseous lesion.  IMPRESSION: 1. Obstructing calculus in the proximal RIGHT ureter. 2. Bosniak 1 renal cyst of the LEFT kidney. 3. No bladder calculi.   Electronically Signed   By: Suzy Bouchard M.D.   On: 05/19/2020 14:24    CT imaging was personally reviewed.  Assessment & Plan:    1. Right ureteral calculus 3 mm right proximal ureteral calculus, on my interpretation appears slightly larger than stated size.  Pain is poorly controlled at this point in time, difficulty with nausea.  We will transition her to Vicodin to see if this narcotic is a little more tolerable and prescribe Zofran.  She is unable to take NSAIDs due to allergy.  We discussed various treatment options for urolithiasis including observation with or without medical expulsive therapy, shockwave lithotripsy (SWL), ureteroscopy and laser lithotripsy with stent placement, and percutaneous nephrolithotomy.   We discussed that management is based on stone size, location, density, patient co-morbidities, and patient preference.    Stones <55mm in size have a >80% spontaneous passage rate. Data surrounding the use of tamsulosin for medical expulsive therapy  is controversial, but meta analyses suggests it is most efficacious for distal stones between 5-69mm in size. Possible side effects include dizziness/lightheadedness, and retrograde ejaculation.   SWL has a lower stone free rate in a single procedure, but also a lower complication rate compared to ureteroscopy and avoids a stent and associated stent related symptoms. Possible complications include renal hematoma, steinstrasse, and need for additional treatment. We discussed the role of his increased skin to stone distance can lead to decreased efficacy with shockwave lithotripsy.   Ureteroscopy with laser lithotripsy and stent  placement has a higher stone free rate than SWL in a single procedure, however increased complication rate including possible infection, ureteral injury, bleeding, and stent related morbidity. Common stent related symptoms include dysuria, urgency/frequency, and flank pain.   After an extensive discussion of the risks and benefits of the above treatment options, the patient would like to proceed with right ESWL  We reviewed warning symptoms extensively and indications for more urgent/emergent evaluation.  She will get a thermometer when she goes to the pharmacy.  She also strain her urine to ensure that she does not pass the stone in the interim.  She was provided with this today as well.  - Urinalysis, Complete - CULTURE, URINE COMPREHENSIVE  2. Right flank pain As above  3. Nausea As above     [name], MD  [facility] [address], [place], [place] [address] [phone]

## 2020-05-21 NOTE — H&P (View-Only) (Signed)
05/21/2020 11:35 AM   Emily Wolf 01-Jan-1986 [id number]  Referring provider: Michaell Cowing, MD [address] [place],  [place] [address]  Chief Complaint  Patient presents with  . Nephrolithiasis    HPI: 35 year old female who presents today for further evaluation of ureteral calculus.  She was seen and evaluated on 05/19/2020 in the emergency room with acute onset right flank pain.  CT of the abdomen pelvis indicated a 3 mm right proximal ureteral calculus with mild pelviectasis and a delayed nephrogram on the side.  No other additional upper tract stones.  Hounsfield units 730.  Stone skin distance 12.5.  She is never had a previous stone episode.  She continues to have severe right flank pain.  She appears to be in discomfort today.  She is accompanied by her boyfriend.  She has been taking Percocet but has nausea every time she takes this pill.  She does report subjective hot flashes but is unsure whether or not these are fevers.  She denies any dysuria.  She does report that her pain is moved more from her right flank down to her right mid abdomen.  Urinalysis today is unremarkable.  PMH: No past medical history on file.  Surgical History: Past Surgical History:  Procedure Laterality Date  . NASAL SINUS SURGERY      Home Medications:  Allergies as of 05/21/2020      Reactions   Aspirin Shortness Of Breath   Ibuprofen Shortness Of Breath   Augmentin [amoxicillin-pot Clavulanate] Hives      Medication List       Accurate as of May 21, 2020 11:35 AM. If you have any questions, ask your nurse or doctor.        albuterol 108 (90 Base) MCG/ACT inhaler Commonly known as: VENTOLIN HFA Inhale 2 puffs into the lungs every 6 (six) hours as needed for wheezing or shortness of breath.   Dupixent 100 MG/0.67ML Sosy Generic drug: Dupilumab Inject into the skin.   Fluticasone-Salmeterol 250-50 MCG/DOSE Aepb Commonly known as:  ADVAIR Inhale 1 puff into the lungs 2 (two) times daily.   HYDROcodone-acetaminophen 5-325 MG tablet Commonly known as: NORCO/VICODIN Take 1-2 tablets by mouth every 6 (six) hours as needed for moderate pain. Started by: [name], MD   ondansetron 4 MG tablet Commonly known as: Zofran Take 1 tablet (4 mg total) by mouth every 8 (eight) hours as needed for nausea or vomiting. Started by: [name], MD   oxyCODONE-acetaminophen 5-325 MG tablet Commonly known as: Percocet Take 1 tablet by mouth every 4 (four) hours as needed for severe pain.   tamsulosin 0.4 MG Caps capsule Commonly known as: FLOMAX Take 1 capsule (0.4 mg total) by mouth daily.       Allergies:  Allergies  Allergen Reactions  . Aspirin Shortness Of Breath  . Ibuprofen Shortness Of Breath  . Augmentin [Amoxicillin-Pot Clavulanate] Hives    Family History: No family history on file.  Social History:  reports that she has never smoked. She has never used smokeless tobacco. She reports current alcohol use. No history on file for drug use.   Physical Exam: BP 114/87   Pulse 73   Ht 4\' 9"  (1.448 m)   LMP 05/12/2020 Comment: neg preg test 05/19/20  BMI 25.76 kg/m   Constitutional: In wheelchair, appears uncomfortable.  Accompanied by boyfriend. HEENT: [place] AT, moist mucus membranes.  Trachea midline, no masses. Cardiovascular: No clubbing, cyanosis, or edema. Respiratory: Normal respiratory  effort, no increased work of breathing. GI: Abdomen is soft, nontender, nondistended, no abdominal masses Skin: No rashes, bruises or suspicious lesions. Neurologic: Grossly intact, no focal deficits, moving all 4 extremities. Psychiatric: Normal mood and affect.  Laboratory Data: Lab Results  Component Value Date   WBC 13.7 (H) 05/19/2020   HGB 11.7 (L) 05/19/2020   HCT 37.8 05/19/2020   MCV 89.4 05/19/2020   PLT 294 05/19/2020    Lab Results  Component Value Date   CREATININE 0.92 05/19/2020     Urinalysis    Component Value Date/Time   COLORURINE AMBER (A) 05/19/2020 1224   APPEARANCEUR CLOUDY (A) 05/19/2020 1224   LABSPEC 1.030 05/19/2020 1224   PHURINE 6.0 05/19/2020 1224   GLUCOSEU NEGATIVE 05/19/2020 1224   HGBUR LARGE (A) 05/19/2020 1224   BILIRUBINUR NEGATIVE 05/19/2020 1224   KETONESUR 5 (A) 05/19/2020 1224   PROTEINUR 100 (A) 05/19/2020 1224   NITRITE NEGATIVE 05/19/2020 1224   LEUKOCYTESUR NEGATIVE 05/19/2020 1224    Lab Results  Component Value Date   BACTERIA FEW (A) 05/19/2020    Pertinent Imaging: Narrative & Impression  CLINICAL DATA:  Abdominal pain.  Acute RIGHT plane with dark urine.  EXAM: CT ABDOMEN AND PELVIS WITH CONTRAST  TECHNIQUE: Multidetector CT imaging of the abdomen and pelvis was performed using the standard protocol following bolus administration of intravenous contrast.  CONTRAST:  11mL OMNIPAQUE IOHEXOL 300 MG/ML  SOLN  COMPARISON:  None.  FINDINGS: Lower chest: Lung bases are clear.  Hepatobiliary: No focal hepatic lesion. No biliary duct dilatation. Common bile duct is normal.  Pancreas: Pancreas is normal. No ductal dilatation. No pancreatic inflammation.  Spleen: Normal spleen  Adrenals/urinary tract: Adrenal glands normal. Partially obstructing calculus in the proximal RIGHT ureter measures 3 mm on image 39/2. This calculus is just beyond the ureteropelvic junction (coronal image 33/5). Mild pelvicaliectasis and delayed renal enhancement on the RIGHT.  Low-density cyst within the LEFT kidney measures 13 mm in the cortex. No LEFT ureteral lithiasis.  No bladder calculi, enhancing bladder lesions, or filling defect within the bladder.  Stomach/Bowel: Stomach, small bowel, appendix, and cecum are normal. The colon and rectosigmoid colon are normal.  Vascular/Lymphatic: Abdominal aorta is normal caliber. No periportal or retroperitoneal adenopathy. No pelvic adenopathy.  Reproductive:  Uterus and adnexa unremarkable.  Other: Small volume free fluid in the pelvis is favored physiologic.  Musculoskeletal: No aggressive osseous lesion.  IMPRESSION: 1. Obstructing calculus in the proximal RIGHT ureter. 2. Bosniak 1 renal cyst of the LEFT kidney. 3. No bladder calculi.   Electronically Signed   By: Suzy Bouchard M.D.   On: 05/19/2020 14:24    CT imaging was personally reviewed.  Assessment & Plan:    1. Right ureteral calculus 3 mm right proximal ureteral calculus, on my interpretation appears slightly larger than stated size.  Pain is poorly controlled at this point in time, difficulty with nausea.  We will transition her to Vicodin to see if this narcotic is a little more tolerable and prescribe Zofran.  She is unable to take NSAIDs due to allergy.  We discussed various treatment options for urolithiasis including observation with or without medical expulsive therapy, shockwave lithotripsy (SWL), ureteroscopy and laser lithotripsy with stent placement, and percutaneous nephrolithotomy.   We discussed that management is based on stone size, location, density, patient co-morbidities, and patient preference.    Stones <100mm in size have a >80% spontaneous passage rate. Data surrounding the use of tamsulosin for medical expulsive therapy  is controversial, but meta analyses suggests it is most efficacious for distal stones between 5-28mm in size. Possible side effects include dizziness/lightheadedness, and retrograde ejaculation.   SWL has a lower stone free rate in a single procedure, but also a lower complication rate compared to ureteroscopy and avoids a stent and associated stent related symptoms. Possible complications include renal hematoma, steinstrasse, and need for additional treatment. We discussed the role of his increased skin to stone distance can lead to decreased efficacy with shockwave lithotripsy.   Ureteroscopy with laser lithotripsy and stent  placement has a higher stone free rate than SWL in a single procedure, however increased complication rate including possible infection, ureteral injury, bleeding, and stent related morbidity. Common stent related symptoms include dysuria, urgency/frequency, and flank pain.   After an extensive discussion of the risks and benefits of the above treatment options, the patient would like to proceed with right ESWL  We reviewed warning symptoms extensively and indications for more urgent/emergent evaluation.  She will get a thermometer when she goes to the pharmacy.  She also strain her urine to ensure that she does not pass the stone in the interim.  She was provided with this today as well.  - Urinalysis, Complete - CULTURE, URINE COMPREHENSIVE  2. Right flank pain As above  3. Nausea As above     [name], MD  [facility] [address], [place], [place] [address] [phone]

## 2020-05-25 LAB — CULTURE, URINE COMPREHENSIVE

## 2020-05-26 ENCOUNTER — Other Ambulatory Visit
Admission: RE | Admit: 2020-05-26 | Discharge: 2020-05-26 | Disposition: A | Discharge status: Home / Self Care | Payer: Managed Care, Other (non HMO) | Source: Ambulatory Visit | Attending: Urology | Admitting: Urology

## 2020-05-26 ENCOUNTER — Other Ambulatory Visit: Payer: Self-pay

## 2020-05-26 DIAGNOSIS — Z01812 Encounter for preprocedural laboratory examination: Secondary | ICD-10-CM | POA: Diagnosis present

## 2020-05-26 DIAGNOSIS — Z20822 Contact with and (suspected) exposure to covid-19: Secondary | ICD-10-CM | POA: Diagnosis not present

## 2020-05-26 LAB — SARS CORONAVIRUS 2 (TAT 6-24 HRS): SARS Coronavirus 2: NEGATIVE

## 2020-05-28 ENCOUNTER — Encounter: Payer: Self-pay | Admitting: Urology

## 2020-05-28 ENCOUNTER — Other Ambulatory Visit: Payer: Self-pay

## 2020-05-28 ENCOUNTER — Encounter
Admission: RE | Disposition: A | Discharge status: Home / Self Care | Payer: Self-pay | Source: Home / Self Care | Attending: Urology

## 2020-05-28 ENCOUNTER — Ambulatory Visit: Payer: Managed Care, Other (non HMO)

## 2020-05-28 ENCOUNTER — Ambulatory Visit
Admission: RE | Admit: 2020-05-28 | Discharge: 2020-05-28 | Disposition: A | Discharge status: Home / Self Care | Payer: Managed Care, Other (non HMO) | Attending: Urology | Admitting: Urology

## 2020-05-28 DIAGNOSIS — Z886 Allergy status to analgesic agent status: Secondary | ICD-10-CM | POA: Insufficient documentation

## 2020-05-28 DIAGNOSIS — Z881 Allergy status to other antibiotic agents status: Secondary | ICD-10-CM | POA: Insufficient documentation

## 2020-05-28 DIAGNOSIS — Z79899 Other long term (current) drug therapy: Secondary | ICD-10-CM | POA: Diagnosis not present

## 2020-05-28 DIAGNOSIS — J45909 Unspecified asthma, uncomplicated: Secondary | ICD-10-CM | POA: Diagnosis not present

## 2020-05-28 DIAGNOSIS — N201 Calculus of ureter: Secondary | ICD-10-CM | POA: Insufficient documentation

## 2020-05-28 HISTORY — PX: EXTRACORPOREAL SHOCK WAVE LITHOTRIPSY: SHX1557

## 2020-05-28 LAB — POCT PREGNANCY, URINE: Preg Test, Ur: NEGATIVE

## 2020-05-28 SURGERY — LITHOTRIPSY, ESWL
Anesthesia: Moderate Sedation | Laterality: Right

## 2020-05-28 MED ORDER — DIAZEPAM 5 MG PO TABS
10.0000 mg | ORAL_TABLET | ORAL | Status: AC
  Administered 2020-05-28: 10 mg via ORAL

## 2020-05-28 MED ORDER — DIPHENHYDRAMINE HCL 25 MG PO CAPS
ORAL_CAPSULE | ORAL | Status: AC
  Filled 2020-05-28: qty 1

## 2020-05-28 MED ORDER — HYDROCODONE-ACETAMINOPHEN 5-325 MG PO TABS: 1.0000 | ORAL_TABLET | Freq: Four times a day (QID) | ORAL | 0 refills | Status: DC | PRN

## 2020-05-28 MED ORDER — DIAZEPAM 5 MG PO TABS
ORAL_TABLET | ORAL | Status: AC
  Filled 2020-05-28: qty 2

## 2020-05-28 MED ORDER — SULFAMETHOXAZOLE-TRIMETHOPRIM 800-160 MG PO TABS
1.0000 | ORAL_TABLET | Freq: Once | ORAL | Status: AC
  Administered 2020-05-28: 1 via ORAL
  Filled 2020-05-28: qty 1

## 2020-05-28 MED ORDER — ONDANSETRON HCL 4 MG/2ML IJ SOLN
INTRAMUSCULAR | Status: AC
  Filled 2020-05-28: qty 2

## 2020-05-28 MED ORDER — SODIUM CHLORIDE 0.9 % IV SOLN: INTRAVENOUS | Status: DC

## 2020-05-28 MED ORDER — DIPHENHYDRAMINE HCL 25 MG PO CAPS
25.0000 mg | ORAL_CAPSULE | ORAL | Status: AC
  Administered 2020-05-28: 25 mg via ORAL

## 2020-05-28 MED ORDER — ONDANSETRON HCL 4 MG/2ML IJ SOLN
4.0000 mg | Freq: Once | INTRAMUSCULAR | Status: AC
  Administered 2020-05-28: 4 mg via INTRAVENOUS

## 2020-05-28 NOTE — Discharge Instructions (Signed)
AMBULATORY SURGERY  DISCHARGE INSTRUCTIONS   1) The drugs that you were given will stay in your system until tomorrow so for the next 24 hours you should not:  A) Drive an automobile B) Make any legal decisions C) Drink any alcoholic beverage   2) You may resume regular meals tomorrow.  Today it is better to start with liquids and gradually work up to solid foods.  You may eat anything you prefer, but it is better to start with liquids, then soup and crackers, and gradually work up to solid foods.   3) Please notify your doctor immediately if you have any unusual bleeding, trouble breathing, redness and pain at the surgery site, drainage, fever, or pain not relieved by medication.    4) Additional Instructions:                                           1.  As per the [facility] discharge instructions                                                2.  Prescription for pain medication was sent to pharmacy                                                3.  Follow-up appointment schedule 06/12/2020     Please contact your physician with any problems or Same Day Surgery at [phone], Monday through Friday 6 am to 4 pm, or [place] [facility] number at [phone].

## 2020-05-28 NOTE — Interval H&P Note (Signed)
History and Physical Interval Note: CV:RRR Lungs:clear  05/28/2020 10:10 AM  Emily Wolf  has presented today for surgery, with the diagnosis of Kidney stone.  The various methods of treatment have been discussed with the patient and family. After consideration of risks, benefits and other options for treatment, the patient has consented to  Procedure(s): EXTRACORPOREAL SHOCK WAVE LITHOTRIPSY (ESWL) (Right) as a surgical intervention.  The patient's history has been reviewed, patient examined, no change in status, stable for surgery.  I have reviewed the patient's chart and labs.  Questions were answered to the patient's satisfaction.     [place]

## 2020-05-29 ENCOUNTER — Encounter: Payer: Self-pay | Admitting: Urology

## 2020-06-11 NOTE — Progress Notes (Signed)
06/12/2020 2:22 PM   Emily Wolf 26-Feb-1986 [id number]  Referring provider: Michaell Cowing, MD [address] [place],  [place] [address]  Chief Complaint  Patient presents with  . Nephrolithiasis    HPI: Emily Wolf is a 35 y.o. who is status post ESWL who presents today for follow up.  Underwent ESWL on 05/28/2020 for 3 mm right proximal ureteral calculus with Dr. Bernardo Heater.  Their postprocedural course was as expected and uneventful.   They have passed fragments. They bring in fragments for analysis.   KUB no stone seen  UA negative for micro heme.   PMH: No past medical history on file.  Surgical History: Past Surgical History:  Procedure Laterality Date  . EXTRACORPOREAL SHOCK WAVE LITHOTRIPSY Right 05/28/2020   Procedure: EXTRACORPOREAL SHOCK WAVE LITHOTRIPSY (ESWL);  Surgeon: Abbie Sons, MD;  Location: [facility] ORS;  Service: Urology;  Laterality: Right;  . NASAL SINUS SURGERY      Home Medications:  Current Outpatient Medications on File Prior to Visit  Medication Sig Dispense Refill  . albuterol (VENTOLIN HFA) 108 (90 Base) MCG/ACT inhaler Inhale 2 puffs into the lungs every 6 (six) hours as needed for wheezing or shortness of breath.    . Dupilumab (DUPIXENT) 100 MG/0.67ML SOSY Inject into the skin.    Marland Kitchen Fluticasone-Salmeterol (ADVAIR) 250-50 MCG/DOSE AEPB Inhale 1 puff into the lungs 2 (two) times daily.    Marland Kitchen HYDROcodone-acetaminophen (NORCO/VICODIN) 5-325 MG tablet Take 1-2 tablets by mouth every 6 (six) hours as needed for moderate pain. 15 tablet 0  . ondansetron (ZOFRAN) 4 MG tablet Take 1 tablet (4 mg total) by mouth every 8 (eight) hours as needed for nausea or vomiting. 20 tablet 0   No current facility-administered medications on file prior to visit.    Allergies:  Allergies  Allergen Reactions  . Aspirin Shortness Of Breath  . Ibuprofen Shortness Of Breath  . Augmentin [Amoxicillin-Pot Clavulanate] Hives    Family  History: No family history on file.  Social History:  reports that she has never smoked. She has never used smokeless tobacco. She reports current alcohol use. No history on file for drug use.  ROS: Pertinent ROS in HPI  Physical Exam: BP 118/80   Pulse 90   Ht 4\' 9"  (1.448 m)   Wt 110 lb (49.9 kg)   BMI 23.80 kg/m   Constitutional:  Well nourished. Alert and oriented, No acute distress. HEENT: [place] AT, mask in place.   Trachea midline. Cardiovascular: No clubbing, cyanosis, or edema. Respiratory: Normal respiratory effort, no increased work of breathing. Neurologic: Grossly intact, no focal deficits, moving all 4 extremities. Psychiatric: Normal mood and affect.  Laboratory Data: Lab Results  Component Value Date   WBC 13.7 (H) 05/19/2020   HGB 11.7 (L) 05/19/2020   HCT 37.8 05/19/2020   MCV 89.4 05/19/2020   PLT 294 05/19/2020    Lab Results  Component Value Date   CREATININE 0.92 05/19/2020    Urinalysis Component     Latest Ref Rng & Units 06/12/2020  Specific Gravity, UA     1.005 - 1.030 1.030  pH, UA     5.0 - 7.5 5.5  Color, UA     Yellow Yellow  Appearance Ur     Clear Hazy (A)  Leukocytes,UA     Negative Negative  Protein,UA     Negative/Trace Negative  Glucose, UA     Negative Negative  Ketones, UA     Negative  Negative  RBC, UA     Negative Negative  Bilirubin, UA     Negative Negative  Urobilinogen, Ur     0.2 - 1.0 mg/dL 0.2  Nitrite, UA     Negative Negative  Microscopic Examination      See below:   Component     Latest Ref Rng & Units 06/12/2020  WBC, UA     0 - 5 /hpf None seen  RBC     0 - 2 /hpf None seen  Epithelial Cells (non renal)     0 - 10 /hpf 0-10  Mucus, UA     Not Estab. Present  Bacteria, UA     None seen/Few Few  I have reviewed the labs.   Pertinent Imaging: CLINICAL DATA:  Previous right ureteral 3 mm calculus 05/19/2020  EXAM: ABDOMEN - 1 VIEW  COMPARISON:  05/19/2020,  05/28/2020  FINDINGS: Stool and gas pattern obscures the renal shadows, worse on the right. No definite radiopaque calculi by plain radiography.  Nonobstructive bowel gas pattern.  Lung bases are clear.  No acute osseous finding.  IMPRESSION: No significant abnormality by plain radiography.  Previous proximal right ureteral calculus by CT may not be visualized by plain radiography.   Electronically Signed   By: Jerilynn Mages.  Shick M.D.   On: 06/14/2020 12:12 I have independently reviewed the films.  See HPI.   Assessment & Plan:    1. Nephrolithiasis -stone sent for analysis -24 hour metabolic work up ordered     Return for pending stone analysis and metabolic work up results .  These notes generated with voice recognition software. I apologize for typographical errors.  Zara Council, PA-C  [facility] [address]  [place], [place] [address] [phone]

## 2020-06-12 ENCOUNTER — Ambulatory Visit
Admission: RE | Admit: 2020-06-12 | Discharge: 2020-06-12 | Disposition: A | Discharge status: Home / Self Care | Payer: Managed Care, Other (non HMO) | Attending: Urology | Admitting: Urology

## 2020-06-12 ENCOUNTER — Ambulatory Visit (INDEPENDENT_AMBULATORY_CARE_PROVIDER_SITE_OTHER): Payer: Managed Care, Other (non HMO) | Admitting: Urology

## 2020-06-12 ENCOUNTER — Encounter: Payer: Self-pay | Admitting: Urology

## 2020-06-12 ENCOUNTER — Other Ambulatory Visit: Payer: Self-pay

## 2020-06-12 ENCOUNTER — Ambulatory Visit
Admission: RE | Admit: 2020-06-12 | Discharge: 2020-06-12 | Disposition: A | Discharge status: Home / Self Care | Payer: Managed Care, Other (non HMO) | Source: Ambulatory Visit | Attending: Urology | Admitting: Urology

## 2020-06-12 VITALS — BP 118/80 | HR 90 | Ht <= 58 in | Wt 110.0 lb

## 2020-06-12 DIAGNOSIS — N201 Calculus of ureter: Secondary | ICD-10-CM | POA: Diagnosis present

## 2020-06-12 LAB — MICROSCOPIC EXAMINATION
RBC, Urine: NONE SEEN /hpf (ref 0–2)
WBC, UA: NONE SEEN /hpf (ref 0–5)

## 2020-06-12 LAB — URINALYSIS, COMPLETE
Bilirubin, UA: NEGATIVE
Glucose, UA: NEGATIVE
Ketones, UA: NEGATIVE
Leukocytes,UA: NEGATIVE
Nitrite, UA: NEGATIVE
Protein,UA: NEGATIVE
RBC, UA: NEGATIVE
Specific Gravity, UA: 1.03 (ref 1.005–1.030)
Urobilinogen, Ur: 0.2 mg/dL (ref 0.2–1.0)
pH, UA: 5.5 (ref 5.0–7.5)

## 2020-06-12 NOTE — Patient Instructions (Signed)

## 2020-06-19 LAB — CALCULI, WITH PHOTOGRAPH (CLINICAL LAB)
Calcium Oxalate Dihydrate: 10 %
Calcium Oxalate Monohydrate: 90 %
Weight Calculi: 35 mg

## 2020-09-28 ENCOUNTER — Other Ambulatory Visit: Payer: Self-pay

## 2020-09-28 ENCOUNTER — Encounter: Payer: Self-pay | Admitting: Emergency Medicine

## 2020-09-28 ENCOUNTER — Emergency Department
Admission: EM | Admit: 2020-09-28 | Discharge: 2020-09-28 | Disposition: A | Discharge status: Home / Self Care | Payer: Managed Care, Other (non HMO) | Attending: Emergency Medicine | Admitting: Emergency Medicine

## 2020-09-28 ENCOUNTER — Emergency Department: Payer: Managed Care, Other (non HMO)

## 2020-09-28 DIAGNOSIS — R102 Pelvic and perineal pain: Secondary | ICD-10-CM | POA: Insufficient documentation

## 2020-09-28 DIAGNOSIS — J45909 Unspecified asthma, uncomplicated: Secondary | ICD-10-CM | POA: Insufficient documentation

## 2020-09-28 DIAGNOSIS — O209 Hemorrhage in early pregnancy, unspecified: Secondary | ICD-10-CM

## 2020-09-28 DIAGNOSIS — O208 Other hemorrhage in early pregnancy: Secondary | ICD-10-CM | POA: Insufficient documentation

## 2020-09-28 DIAGNOSIS — Z3A01 Less than 8 weeks gestation of pregnancy: Secondary | ICD-10-CM | POA: Diagnosis not present

## 2020-09-28 DIAGNOSIS — Z7951 Long term (current) use of inhaled steroids: Secondary | ICD-10-CM | POA: Diagnosis not present

## 2020-09-28 DIAGNOSIS — O418X10 Other specified disorders of amniotic fluid and membranes, first trimester, not applicable or unspecified: Secondary | ICD-10-CM

## 2020-09-28 DIAGNOSIS — O468X1 Other antepartum hemorrhage, first trimester: Secondary | ICD-10-CM

## 2020-09-28 LAB — CBC WITH DIFFERENTIAL/PLATELET
Abs Immature Granulocytes: 0.02 10*3/uL (ref 0.00–0.07)
Basophils Absolute: 0.1 10*3/uL (ref 0.0–0.1)
Basophils Relative: 1 %
Eosinophils Absolute: 1.4 10*3/uL — ABNORMAL HIGH (ref 0.0–0.5)
Eosinophils Relative: 13 %
HCT: 38.1 % (ref 36.0–46.0)
Hemoglobin: 12.4 g/dL (ref 12.0–15.0)
Immature Granulocytes: 0 %
Lymphocytes Relative: 26 %
Lymphs Abs: 2.8 10*3/uL (ref 0.7–4.0)
MCH: 28.6 pg (ref 26.0–34.0)
MCHC: 32.5 g/dL (ref 30.0–36.0)
MCV: 88 fL (ref 80.0–100.0)
Monocytes Absolute: 0.7 10*3/uL (ref 0.1–1.0)
Monocytes Relative: 6 %
Neutro Abs: 5.8 10*3/uL (ref 1.7–7.7)
Neutrophils Relative %: 54 %
Platelets: 285 10*3/uL (ref 150–400)
RBC: 4.33 MIL/uL (ref 3.87–5.11)
RDW: 13.4 % (ref 11.5–15.5)
WBC: 10.8 10*3/uL — ABNORMAL HIGH (ref 4.0–10.5)
nRBC: 0 % (ref 0.0–0.2)

## 2020-09-28 LAB — ABO/RH: ABO/RH(D): A POS

## 2020-09-28 LAB — POC URINE PREG, ED: Preg Test, Ur: POSITIVE — AB

## 2020-09-28 LAB — HCG, QUANTITATIVE, PREGNANCY: hCG, Beta Chain, Quant, S: 6852 m[IU]/mL — ABNORMAL HIGH (ref <5)

## 2020-09-28 NOTE — ED Notes (Signed)
Patient off the floor in ultrasound at this time.

## 2020-09-28 NOTE — ED Notes (Signed)
Patient is resting comfortably. 

## 2020-09-28 NOTE — ED Triage Notes (Signed)
Vaginal bleeding since Saturday night. STates she is [redacted] weeks pregnant.  LMP:   08/04/2020.  G1 P0  AAOx3.  Skin warm and dry> NAD

## 2020-09-28 NOTE — ED Notes (Signed)
Patient verbalized understanding of all discharge instructions provided.

## 2020-09-28 NOTE — ED Notes (Signed)
ED Provider at bedside. 

## 2020-09-28 NOTE — ED Notes (Signed)
POC was POSITIVE!

## 2020-09-28 NOTE — ED Provider Notes (Signed)
[facility] Emergency Department Provider Note   ____________________________________________   Event Date/Time   First MD Initiated Contact with Patient 09/28/20 1722     (approximate)  I have reviewed the triage vital signs and the nursing notes.   HISTORY  Chief Complaint Vaginal Bleeding    HPI Emily Wolf is a 35 y.o. female reports history of exercise-induced asthma well-controlled  Known history of kidney stone in February  Presents today pregnant, last menstrual cycle was May 3 estimates she is about [redacted] weeks pregnant with her first pregnancy G1 P0  Pregnancy is gone well, saw her doctor and was referred for follow-up routinely with [facility] OB/GYN.  Not on Friday she noticed a very small amount of spotting, and then Saturday also saw small amount of spotting, then also noted that she seemed to feel was small amount of clotting passed over the last day.  She describes bleeding as very light, she has a history of heavy menstrual cycles and this is not heavy  No fevers or chills no abdominal pain.  She reports she felt just some very minimal cramping yesterday.  Not in any pain or discomfort now no vomiting no fevers no chills.  No other symptoms  Do not use any fertility treatments.  Natural pregnancy.  No history of surgeries or abdominal surgery other than a kidney stone   No past medical history on file.  Patient Active Problem List   Diagnosis Date Noted   Allergic rhinitis 04/14/2019   Aspirin sensitivity 07/26/2017    Past Surgical History:  Procedure Laterality Date   EXTRACORPOREAL SHOCK WAVE LITHOTRIPSY Right 05/28/2020   Procedure: EXTRACORPOREAL SHOCK WAVE LITHOTRIPSY (ESWL);  Surgeon: Abbie Sons, MD;  Location: [facility] ORS;  Service: Urology;  Laterality: Right;   NASAL SINUS SURGERY      Prior to Admission medications   Medication Sig Start Date End Date Taking? Authorizing Provider  albuterol (VENTOLIN HFA) 108 (90  Base) MCG/ACT inhaler Inhale 2 puffs into the lungs every 6 (six) hours as needed for wheezing or shortness of breath.    [provider]  Dupilumab (DUPIXENT) 100 MG/0.67ML SOSY Inject into the skin.    [provider]  Fluticasone-Salmeterol (ADVAIR) 250-50 MCG/DOSE AEPB Inhale 1 puff into the lungs 2 (two) times daily.    [provider]  HYDROcodone-acetaminophen (NORCO/VICODIN) 5-325 MG tablet Take 1-2 tablets by mouth every 6 (six) hours as needed for moderate pain. 05/28/20   Stoioff, Ronda Fairly, MD  ondansetron (ZOFRAN) 4 MG tablet Take 1 tablet (4 mg total) by mouth every 8 (eight) hours as needed for nausea or vomiting. 05/21/20   [name], MD  Stoped taking DilipuMab  Allergies Aspirin, Ibuprofen, and Augmentin [amoxicillin-pot clavulanate]  No family history on file.  Social History Social History   Tobacco Use   Smoking status: Never   Smokeless tobacco: Never  Substance Use Topics   Alcohol use: Yes    Comment: occasional    Review of Systems Constitutional: No fever/chills Cardiovascular: Denies chest pain. Respiratory: Denies shortness of breath. Gastrointestinal: No abdominal pain.   Genitourinary: Negative for dysuria.  Vaginal spotting.  No other discharge except passage of one clot. Musculoskeletal: Negative for back pain. Skin: Negative for rash. Neurological: Negative for headaches, areas of focal weakness or numbness.    ____________________________________________   PHYSICAL EXAM:  VITAL SIGNS: ED Triage Vitals  Enc Vitals Group     BP 09/28/20 1537 119/66     Pulse  Rate 09/28/20 1537 86     Resp 09/28/20 1537 16     Temp 09/28/20 1537 98.2 F (36.8 C)     Temp Source 09/28/20 1537 Oral     SpO2 09/28/20 1537 100 %     Weight 09/28/20 1535 110 lb 0.2 oz (49.9 kg)     Height 09/28/20 1535 4\' 9"  (1.448 m)     Head Circumference --      Peak Flow --      Pain Score 09/28/20 1535 0     Pain Loc --      Pain Edu?  --      Excl. in [place]? --     Constitutional: Alert and oriented. Well appearing and in no acute distress. Eyes: Conjunctivae are normal. Head: Atraumatic. Nose: No congestion/rhinnorhea. Mouth/Throat: Mucous membranes are moist. Neck: No stridor.  Cardiovascular: Normal rate, regular rhythm. Grossly normal heart sounds.  Good peripheral circulation. Respiratory: Normal respiratory effort.  No retractions. Lungs CTAB. Gastrointestinal: Soft and nontender. No distention.  Not obviously gravid.  No pain or discomfort to exam.  Normal abdominal exam. Musculoskeletal: No lower extremity tenderness nor edema. Neurologic:  Normal speech and language. No gross focal neurologic deficits are appreciated.  Skin:  Skin is warm, dry and intact. No rash noted. Psychiatric: Mood and affect are normal. Speech and behavior are normal.  ____________________________________________   LABS (all labs ordered are listed, but only abnormal results are displayed)  Labs Reviewed  CBC WITH DIFFERENTIAL/PLATELET - Abnormal; Notable for the following components:      Result Value   WBC 10.8 (*)    Eosinophils Absolute 1.4 (*)    All other components within normal limits  HCG, QUANTITATIVE, PREGNANCY - Abnormal; Notable for the following components:   hCG, Beta Chain, Quant, S 6,852 (*)    All other components within normal limits  POC URINE PREG, ED - Abnormal; Notable for the following components:   Preg Test, Ur POSITIVE (*)    All other components within normal limits  ABO/RH   ____________________________________________  EKG   ____________________________________________  RADIOLOGY  IMPRESSION:  1. Probable early intrauterine gestational sac, but no yolk sac,  fetal pole, or cardiac activity yet visualized. Recommend follow-up  quantitative B-HCG levels and follow-up US in 14 days to assess  viability. This recommendation follows SRU consensus guidelines:  Diagnostic Criteria for Nonviable  Pregnancy Early in the First  Trimester. [place] Med 2013; 952:8413-24.  2. Moderate subchorionic hemorrhage.  ____________________________________________   PROCEDURES  Procedure(s) performed: None  Procedures  Critical Care performed: No  ____________________________________________   INITIAL IMPRESSION / ASSESSMENT AND PLAN / ED COURSE  Pertinent labs & imaging results that were available during my care of the patient were reviewed by me and considered in my medical decision making (see chart for details).   A positive blood type per [facility] records.  Patient presents for spotting, in the setting of early first trimester pregnancy roughly 7 to 8 weeks.  Transvaginal ultrasound reviewed  Patient's hCG is somewhat below expectation for 7 to 8-week pregnancy concerning for potential complications such as miscarriage  She has no associated abdominal pain no infectious symptoms.  Minimal bleeding, no heavy bleeding.  Stable exam normal vital signs.  Normal hemoglobin.  ----------------------------------------- 6:53 PM on 09/28/2020 -----------------------------------------  IMPRESSION:  1. Probable early intrauterine gestational sac, but no yolk sac,  fetal pole, or cardiac activity yet visualized. Recommend follow-up  quantitative B-HCG levels and follow-up US in  14 days to assess  viability. This recommendation follows SRU consensus guidelines:  Diagnostic Criteria for Nonviable Pregnancy Early in the First  Trimester. [place] Med 2013; 993:7169-67.  2. Moderate subchorionic hemorrhage.    Case reviewed for follow-up with Dr. Marcelline Mates, she advises [facility] will reach out to patient to set up 48-hour quant    Discussed and reviewed results of ultrasound with patient as well as her invited friend who she describes as the father the child.  We discussed that there is not certainty at this point as to the exact health of the baby, she could be at risk for experiencing  miscarriage, blighted ovum, or the baby may be continuing to grow.  She is understanding, and understands need for follow-up with OB/GYN for repeat pregnancy hCG testing in 48 hours, anticipates she will follow-up with [facility] for now as has not yet established with [facility]  Return precautions and treatment recommendations and follow-up discussed with the patient who is agreeable with the plan.  ____________________________________________   FINAL CLINICAL IMPRESSION(S) / ED DIAGNOSES  Final diagnoses:  First trimester bleeding  Subchorionic hemorrhage of placenta in first trimester, single or unspecified fetus        Note:  This document was prepared using Dragon voice recognition software and may include unintentional dictation errors       Delman Kitten, MD 09/28/20 [phone]

## 2020-09-28 NOTE — ED Notes (Signed)
Patient is resting comfortably. ED provider remains at bedside.

## 2020-09-28 NOTE — ED Notes (Signed)
Patient reports light cramping and vaginal bleeding x 2 days. Patient reports LMP May 3rd. Patient reports OB appointment pending.

## 2020-09-30 ENCOUNTER — Ambulatory Visit (INDEPENDENT_AMBULATORY_CARE_PROVIDER_SITE_OTHER): Payer: Managed Care, Other (non HMO) | Admitting: Obstetrics and Gynecology

## 2020-09-30 ENCOUNTER — Encounter: Payer: Self-pay | Admitting: Obstetrics and Gynecology

## 2020-09-30 ENCOUNTER — Other Ambulatory Visit (HOSPITAL_COMMUNITY)
Admission: RE | Admit: 2020-09-30 | Discharge: 2020-09-30 | Disposition: A | Discharge status: Home / Self Care | Payer: Managed Care, Other (non HMO) | Source: Ambulatory Visit | Attending: Obstetrics and Gynecology | Admitting: Obstetrics and Gynecology

## 2020-09-30 ENCOUNTER — Other Ambulatory Visit: Payer: Self-pay

## 2020-09-30 VITALS — BP 117/75 | HR 76 | Ht <= 58 in | Wt 121.3 lb

## 2020-09-30 DIAGNOSIS — D259 Leiomyoma of uterus, unspecified: Secondary | ICD-10-CM

## 2020-09-30 DIAGNOSIS — O034 Incomplete spontaneous abortion without complication: Secondary | ICD-10-CM | POA: Diagnosis present

## 2020-09-30 NOTE — Patient Instructions (Signed)
Miscarriage A miscarriage is the loss of a pregnancy before the 20th week of pregnancy.Sometimes, a pregnancy ends before a woman knows that she is pregnant. If you lose a pregnancy, talk with your doctor about: Questions you have about the loss of your baby. How to work through your grief. Plans for future pregnancy. What are the causes? Many times, the cause of this condition is not known. What increases the risk? These things may make a pregnant woman more likely to lose a pregnancy: Certain health conditions Conditions that affect hormones, such as: Thyroid disease. Polycystic ovary syndrome. Diabetes. A disease that causes the body's disease-fighting system to attack itself by mistake. Infections. Bleeding problems. Being very overweight. Lifestyle factors Using products that have tobacco or nicotine in them. Being around tobacco smoke. Having alcohol. Having a lot of caffeine. Using drugs. Problems with reproductive organs or parts Having a cervix that opens and thins before your due date. The cervix is the lowest part of your womb. Having Asherman syndrome, which leads to: Scars in the womb. The womb being abnormal in shape. Growths (fibroids) in the womb. Problems in the body that are present at birth. Infection of the cervix or womb. Personal or health history Injury. Having lost a pregnancy before. Being younger than age 73 or older than age 33. Being around a harmful substance, such as radiation. Having lead or other heavy metals in: Things you eat or drink. The air around you. Using certain medicines. What are the signs or symptoms? Blood or spots of blood coming from the vagina. You may also have cramps or pain. Pain or cramps in the belly or low back. Fluid or tissue coming out of the vagina. How is this treated? Sometimes, treatment is not needed. If you need treatment, you may be treated with: A procedure to open the cervix more and take tissue out of  the womb. Medicines. You may get a shot of medicine called Rho(D) immune globulin. Follow these instructions at home: Medicines Take over-the-counter and prescription medicines only as told by your doctor. If you were prescribed antibiotic medicine, take it as told by your doctor. Do not stop taking it even if you start to feel better. Activity Rest as told by your doctor. Ask your doctor what activities are safe for you. Have someone help you at home during this time. General instructions  Watch how much tissue comes out of the vagina. Watch the size of any blood clots that come out of the vagina. Do not have sex or douche until your doctor says it is okay. Do not put things, such as tampons, in your vagina until your doctor says it is okay. To help you and your partner with grieving: Talk with your doctor. See a Social worker. When you are ready, talk with your doctor about: Things to do for your health. How you can be healthy if you get pregnant again. Keep all follow-up visits.  Where to find more information [employer] of [employer]stetricians and Gynecologists: acog.org U.S. Department of Health and Programmer, systems of Women's Health: EverydayCosmetics.no Contact a doctor if: You have a fever or chills. There is bad-smelling fluid coming from the vagina. You have more bleeding. Tissue or clots of blood come out of your vagina. Get help right away if: You have very bad cramps or pain in your back or belly. You soak more than 2 large pads in an hour for more than 2 hours. You get light-headed or weak. You faint.  You feel sad, and you have sad thoughts a lot of the time. You think about hurting yourself. Get help right awayif you feel like you may hurt yourself or others, or have thoughts about taking your own life. Go to your nearest emergency room or: Call your local emergency services (911 in the [place] Call the [place] at  203-741-9345. This is open 24 hours a day. Text the Crisis Text Line at (760) 321-4013. Summary A miscarriage is the loss of a pregnancy before the 20th week of pregnancy. Sometimes, a pregnancy ends before a woman knows that she is pregnant. Follow instructions from your doctor about medicines and activity. To help you and your partner with grieving, talk with your doctor or a counselor. Keep all follow-up visits. This information is not intended to replace advice given to you by your health care provider. Make sure you discuss any questions you have with your healthcare provider. Document Revised: 09/20/2019 Document Reviewed: 09/20/2019 Elsevier Patient Education  Emily Wolf.

## 2020-09-30 NOTE — Progress Notes (Signed)
Pt present for ED follow up for early bleeding in pregnancy. Pt stated that she had a miscarriage yesterday.

## 2020-09-30 NOTE — Progress Notes (Signed)
GYNECOLOGY PROGRESS NOTE  Subjective:    Patient ID: Emily Wolf, female    DOB: October 01, 1985, 35 y.o.   MRN: [id number]  HPI  Patient is a 35 y.o. G1P0 female who presents for follow up from the Emergency Room due to complaints of vaginal bleeding in early pregnancy.  She is approximately 8.1 weeks by her LMP.  Was initially scheduled to follow up at [facility] for her pregnancy, but notes she was not able to get an appointment until next week.  Patient reports that she had been bleeding since Saturday.  The bleeding initially started as spotting, however progressed light bleeding with passage of clots on Monday which prompted her to be evaluated.  Also has been noting cramping since Monday.  She reports that yesterday the bleeding became heavier, and that she passed what she thinks may have been tissue (saw a "bubble with fluid in it").  Notes bleeding is moderate but slowing down, still noting clots, but is still noting cramping.   The following portions of the patient's history were reviewed and updated as appropriate: She  has a past medical history of Asthma.  She  has a past surgical history that includes Nasal sinus surgery and Extracorporeal shock wave lithotripsy (Right, 05/28/2020).  Her family history includes Asthma in her mother; Diabetes in her maternal grandmother; Healthy in her father. She  reports that she has never smoked. She has never used smokeless tobacco. She reports current alcohol use. She reports that she does not use drugs.  Current Outpatient Medications on File Prior to Visit  Medication Sig Dispense Refill   albuterol (VENTOLIN HFA) 108 (90 Base) MCG/ACT inhaler Inhale 2 puffs into the lungs every 6 (six) hours as needed for wheezing or shortness of breath.     Fluticasone-Salmeterol (ADVAIR) 250-50 MCG/DOSE AEPB Inhale 1 puff into the lungs 2 (two) times daily.     HYDROcodone-acetaminophen (NORCO/VICODIN) 5-325 MG tablet Take 1-2 tablets by mouth every 6 (six)  hours as needed for moderate pain. 15 tablet 0   ondansetron (ZOFRAN) 4 MG tablet Take 1 tablet (4 mg total) by mouth every 8 (eight) hours as needed for nausea or vomiting. 20 tablet 0   Dupilumab (DUPIXENT) 100 MG/0.67ML SOSY Inject into the skin. (Patient not taking: Reported on 09/30/2020)     No current facility-administered medications on file prior to visit.   She is allergic to aspirin, ibuprofen, and augmentin [amoxicillin-pot clavulanate]..  Review of Systems Pertinent items noted in HPI and remainder of comprehensive ROS otherwise negative.   Objective:   Blood pressure 117/75, pulse 76, height 4\' 9"  (1.448 m), weight 121 lb 4.8 oz (55 kg), last menstrual period 08/04/2020. Body mass index is 26.25 kg/m. General appearance: alert and no distress Abdomen: soft, non-tender; bowel sounds normal; no masses,  no organomegaly Pelvic: external genitalia normal, rectovaginal septum normal.  Vagina with small to moderate amount of blood and small amount of tissue products visualized through a slightly dilated cervix.  Uterus mobile, nontender, normal shape and size.  Adnexae non-palpable, nontender bilaterally.  Extremities: extremities normal, atraumatic, no cyanosis or edema Neurologic: Grossly normal    Labs:  Admission on 09/28/2020, Discharged on 09/28/2020  Component Date Value Ref Range Status   Preg Test, Ur 09/28/2020 POSITIVE (A) NEGATIVE Final   Comment:        THE SENSITIVITY OF THIS METHODOLOGY IS >24 mIU/mL    WBC 09/28/2020 10.8 (A) 4.0 - 10.5 K/uL Final   RBC 09/28/2020 4.33  3.87 - 5.11 MIL/uL Final   Hemoglobin 09/28/2020 12.4  12.0 - 15.0 g/dL Final   HCT 09/28/2020 38.1  36.0 - 46.0 % Final   MCV 09/28/2020 88.0  80.0 - 100.0 fL Final   MCH 09/28/2020 28.6  26.0 - 34.0 pg Final   MCHC 09/28/2020 32.5  30.0 - 36.0 g/dL Final   RDW 09/28/2020 13.4  11.5 - 15.5 % Final   Platelets 09/28/2020 285  150 - 400 K/uL Final   nRBC 09/28/2020 0.0  0.0 - 0.2 % Final    Neutrophils Relative % 09/28/2020 54  % Final   Neutro Abs 09/28/2020 5.8  1.7 - 7.7 K/uL Final   Lymphocytes Relative 09/28/2020 26  % Final   Lymphs Abs 09/28/2020 2.8  0.7 - 4.0 K/uL Final   Monocytes Relative 09/28/2020 6  % Final   Monocytes Absolute 09/28/2020 0.7  0.1 - 1.0 K/uL Final   Eosinophils Relative 09/28/2020 13  % Final   Eosinophils Absolute 09/28/2020 1.4 (A) 0.0 - 0.5 K/uL Final   Basophils Relative 09/28/2020 1  % Final   Basophils Absolute 09/28/2020 0.1  0.0 - 0.1 K/uL Final   Immature Granulocytes 09/28/2020 0  % Final   Abs Immature Granulocytes 09/28/2020 0.02  0.00 - 0.07 K/uL Final   Performed at [facility], [address] [place], [place] [address]   ABO/RH(D) 09/28/2020    Final                   Value:A POS Performed at [facility], [address] [place], [place] [address]    hCG, Levy Sjogren, S 09/28/2020 6,852 (A) <5 mIU/mL Final   Comment:          GEST. AGE      CONC.  (mIU/mL)   <=1 WEEK        5 - 50     2 WEEKS       50 - 500     3 WEEKS       100 - 10,000     4 WEEKS     1,000 - 30,000     5 WEEKS     3,500 - 115,000   6-8 WEEKS     12,000 - 270,000    12 WEEKS     15,000 - 220,000        FEMALE AND NON-PREGNANT FEMALE:     LESS THAN 5 mIU/mL Performed at [facility], [address], [place], [place] [address]      Imaging:  US OB LESS THAN 14 WEEKS WITH OB TRANSVAGINAL CLINICAL DATA:  Vaginal bleeding. Estimated gestational age of [redacted] weeks, 6 days by LMP.  EXAM: OBSTETRIC <14 WK [place] AND TRANSVAGINAL OB US  TECHNIQUE: Both transabdominal and transvaginal ultrasound examinations were performed for complete evaluation of the gestation as well as the maternal uterus, adnexal regions, and pelvic cul-de-sac. Transvaginal technique was performed to assess early pregnancy.  COMPARISON:  CT abdomen pelvis dated May 19, 2020.  FINDINGS: Intrauterine gestational sac: Single.  Yolk sac:  Not  Visualized.  Embryo:  Not Visualized.  MSD: 7.5 mm   5 w   4 d  Subchorionic hemorrhage: Moderate subchorionic hemorrhage measuring 9 x 9 x 5 mm.  Maternal uterus/adnexae: Multiple uterine fibroids, the largest in the right fundus measuring 2.9 cm. The ovaries are unremarkable.  No free fluid in the pelvis.  IMPRESSION: 1. Probable early intrauterine gestational sac, but  no yolk sac, fetal pole, or cardiac activity yet visualized. Recommend follow-up quantitative B-HCG levels and follow-up US in 14 days to assess viability. This recommendation follows SRU consensus guidelines: Diagnostic Criteria for Nonviable Pregnancy Early in the First Trimester. [place] [employer] Med 2013; 606:0045-99. 2. Moderate subchorionic hemorrhage.  Electronically Signed   By: Titus Dubin M.D.   On: 09/28/2020 18:14   Assessment:  Incomplete miscarriage Uterine fibroid  Plan:   Incomplete miscarriage - appears that process is still ongoing. Given bleeding precautions.  Advised that bleeding should subside over the next few days to a week once process is completed. Can take Tylenol for pain (allergic to NSAIDs). Also has a prescription for Vicodin that she received from the ER if pain significantly increases. Is Rh positive, Rhogam not indicated.  Uterine fibroids - noted on recent ultrasound.  Largest noted is 2.9 cm.  Not bothersome to patient, and unlikely to affect future pregnancy.  Patient to f/u in 2 weeks    Rubie Maid, MD [facility]

## 2020-10-06 LAB — SURGICAL PATHOLOGY

## 2020-10-15 ENCOUNTER — Encounter: Payer: Self-pay | Admitting: Obstetrics and Gynecology

## 2020-10-15 ENCOUNTER — Other Ambulatory Visit: Payer: Self-pay

## 2020-10-15 ENCOUNTER — Ambulatory Visit (INDEPENDENT_AMBULATORY_CARE_PROVIDER_SITE_OTHER): Payer: Managed Care, Other (non HMO) | Admitting: Obstetrics and Gynecology

## 2020-10-15 VITALS — BP 108/74 | HR 88 | Ht <= 58 in | Wt 122.5 lb

## 2020-10-15 DIAGNOSIS — O039 Complete or unspecified spontaneous abortion without complication: Secondary | ICD-10-CM | POA: Diagnosis not present

## 2020-10-15 NOTE — Progress Notes (Signed)
    GYNECOLOGY PROGRESS NOTE  Subjective:    Patient ID: Emily Wolf, female    DOB: [birth year], 35 y.o.   MRN: [id number]  HPI  Patient is a 35 y.o. G31P0010 female who presents for 2 week follow up of miscarriage. Notes that she is doing well, no issues.  Has stopped bleeding, denies any further cramping or pain.   The following portions of the patient's history were reviewed and updated as appropriate: allergies, current medications, past family history, past medical history, past social history, past surgical history, and problem list.  Review of Systems Pertinent items noted in HPI and remainder of comprehensive ROS otherwise negative.   Objective:   Blood pressure 108/74, pulse 88, height 4\' 9"  (1.448 m), weight 122 lb 8 oz (55.6 kg), last menstrual period 08/04/2020, unknown if currently breastfeeding. General appearance: no distress Abdomen: soft, non-tender; bowel sounds normal; no masses,  no organomegaly Pelvic: external genitalia normal, rectovaginal septum normal.  Vagina without discharge.  Cervix normal appearing, no lesions and no motion tenderness.  Bimanual exam not performed.  Extremities: extremities normal, atraumatic, no cyanosis or edema Neurologic: Grossly normal   Labs:   Results for Emily Wolf, Emily Wolf (MRN [id number]) as of 10/15/2020 21:47  Ref. Range 09/28/2020 15:43  HCG, Beta Chain, Quant, S Latest Ref Range: <5 mIU/mL 6,852 (H)    Assessment:   Miscarriage  Plan:   - Discussion had again regarding plans after miscarriage. Patient notes for now she plans for celibacy with partner until marriage, however will use condoms for back up if needed.  - Offered supportive services such as miscarriage support groups.  - Repeat BHCG levels performed today.  - Return to clinic for any scheduled appointments or for any gynecologic concerns as needed. To be scheduled for annual exam in 3 months.    Rubie Maid, MD [facility]

## 2020-10-15 NOTE — Patient Instructions (Signed)
Managing Pregnancy Loss Pregnancy loss can happen any time during a pregnancy. Often the cause is not known. It is rarely because of anything you did. Pregnancy loss in early pregnancy (during the first trimester) is called a miscarriage. This type of pregnancy loss is the most common. Pregnancy loss that happens after 20 weeks of pregnancy is called fetal demise if the baby's heart stops beating before birth. Fetal demise is much less common. Some women experience spontaneous labor shortly after fetal demise resulting in a stillborn birth (stillbirth). Any pregnancy loss can be devastating. You will need to recover both physically and emotionally. Most women are able to get pregnant again after a pregnancyloss and deliver a healthy baby. How to manage emotional recovery  Pregnancy loss is very hard emotionally. You may feel many different emotions while you grieve. You may feel sad and angry. You may also feel guilty. It is normal to have periods of crying. Emotional recovery can take longer thanphysical recovery. It is different for everyone. Taking these steps can help you in managing this loss: Remember that it is unlikely you did anything to cause the pregnancy loss. Share your thoughts and feelings with friends, family, and your partner. Remember that your partner is also recovering emotionally. Make sure you have a good support system. Do not spend too much time alone. Meet with a pregnancy loss counselor or join a pregnancy loss support group. Get enough sleep and eat a healthy diet. Return to regular exercise when you have recovered physically. Do not use drugs or alcohol to manage your emotions. Consider seeing a mental health professional to help you recover emotionally. Ask a friend or loved one to help you decide what to do with any clothing and nursery items you received for your baby. In the case of a stillbirth, many women benefit from taking additional steps in the grieving process.  You may want to: Hold your baby after the birth. Name your baby. Request a birth certificate. Create a keepsake such as handprints or footprints. Dress your baby and have a picture taken. Make funeral arrangements. Ask for a baptism or blessing. Hospitals have staff members who can help you with all these arrangements. How to recognize emotional stress It is normal to have emotional stress after a pregnancy loss. But emotional stress that lasts a long time or becomes severe requires treatment. Watch out for these signs of severe emotional stress: Sadness, anger, or guilt that is not going away and is interfering with your normal activities. Relationship problems that have occurred or gotten worse since the pregnancy loss. Signs of depression that last longer than 2 weeks. These may include: Sadness. Anxiety. Hopelessness. Loss of interest in activities you enjoy. Inability to concentrate. Trouble sleeping or sleeping too much. Loss of appetite or overeating. Thoughts of death or of hurting yourself. Follow these instructions at home: Take over-the-counter and prescription medicines only as told by your health care provider. Rest at home until your energy level returns. Return to your normal activities as told by your health care provider. Ask your health care provider what activities are safe for you. When you are ready, meet with your health care provider to discuss steps to take for a future pregnancy. Keep all follow-up visits as told by your health care provider. This is important. Where to find support To help you and your partner with the process of grieving, talk with your health care provider or seek counseling. Consider meeting with others who have experienced pregnancy  loss. Ask your health care provider about support groups and resources. Where to find more information U.S. Department of Health and Programmer, systems on Women's Health: [place] American  Pregnancy Association: www.americanpregnancy.org Contact a health care provider if: You continue to experience grief, sadness, or lack of motivation for everyday activities, and those feelings do not improve over time. You are struggling to recover emotionally, especially if you are using alcohol or substances to help. Get help right away if: You have thoughts of hurting yourself or others. If you ever feel like you may hurt yourself or others, or have thoughts about taking your own life, get help right away. You can go to your nearest emergency department or call: Your local emergency services (911 in the [place] A suicide crisis helpline, such as the [place] at (929)713-0442. This is open 24 hours a day. Summary Any pregnancy loss can be difficult physically and emotionally. You may experience many different emotions while you grieve. Emotional recovery can last longer than physical recovery. It is normal to have emotional stress after a pregnancy loss. But emotional stress that lasts a long time or becomes severe requires treatment. See your health care provider if you are struggling emotionally after a pregnancy loss. This information is not intended to replace advice given to you by your health care provider. Make sure you discuss any questions you have with your healthcare provider. Document Revised: 07/11/2018 Document Reviewed: 06/01/2017 Elsevier Patient Education  2022 [employer].

## 2020-10-15 NOTE — Progress Notes (Signed)
Pt present for follow up miscarriage. Pt stated having sharp pains in the abd area. PHQ-9=7. GAD-7=3.

## 2020-10-16 LAB — HUMAN CHORIONIC GONADOTROPIN(HCG),B-SUBUNIT,QUANTITATIVE): HCG, Beta Chain, Quant, S: 4 m[IU]/mL

## 2021-01-27 ENCOUNTER — Encounter: Payer: Managed Care, Other (non HMO) | Admitting: Obstetrics and Gynecology

## 2021-03-30 ENCOUNTER — Other Ambulatory Visit: Payer: Self-pay

## 2021-03-30 ENCOUNTER — Encounter: Payer: Self-pay | Admitting: Obstetrics and Gynecology

## 2021-03-30 ENCOUNTER — Ambulatory Visit (INDEPENDENT_AMBULATORY_CARE_PROVIDER_SITE_OTHER): Payer: Self-pay | Admitting: Obstetrics and Gynecology

## 2021-03-30 VITALS — BP 121/63 | HR 89 | Ht <= 58 in | Wt 139.3 lb

## 2021-03-30 DIAGNOSIS — O3680X0 Pregnancy with inconclusive fetal viability, not applicable or unspecified: Secondary | ICD-10-CM

## 2021-03-30 DIAGNOSIS — N912 Amenorrhea, unspecified: Secondary | ICD-10-CM

## 2021-03-30 DIAGNOSIS — Z8759 Personal history of other complications of pregnancy, childbirth and the puerperium: Secondary | ICD-10-CM

## 2021-03-30 DIAGNOSIS — Z3200 Encounter for pregnancy test, result unknown: Secondary | ICD-10-CM

## 2021-03-30 LAB — POCT URINE PREGNANCY: Preg Test, Ur: POSITIVE — AB

## 2021-03-30 NOTE — Progress Notes (Signed)
° ° °  GYNECOLOGY CLINIC PROGRESS NOTE  Subjective:    Emily Wolf is a 35 y.o. G64P0010 female who presents for evaluation of amenorrhea. She believes she could be pregnant. Pregnancy is desired. Sexual Activity: single partner, contraception: none. Current symptoms also include: breast tenderness and positive home pregnancy test (x 5). Last period was normal. Patient's last menstrual period was 01/22/2021 (exact date).  Of note, patient has h/o 1st trimester miscarriage in June of this year. Also with small fibroid uterus  The following portions of the patient's history were reviewed and updated as appropriate: allergies, current medications, past family history, past medical history, past social history, past surgical history, and problem list.  Review of Systems Pertinent items noted in HPI and remainder of comprehensive ROS otherwise negative.     Objective:    BP 121/63    Pulse 89    Ht 4\' 9"  (1.448 m)    Wt 139 lb 4.8 oz (63.2 kg)    LMP 01/22/2021 (Exact Date)    BMI 30.14 kg/m  General: alert, no distress, and no acute distress    Lab Review Urine HCG: positive    Assessment:   Amenorrhea Positive pregnancy test History of miscarriage Fibroid uterus   Plan:   - Pregnancy Test: Positive: EDC: 10/29/2020, with EGA 9.4 weeks. Briefly discussed pre-natal care options. Pregnancy, Childbirth and the Newborn book given. Encouraged well-balanced diet, plenty of rest when needed, pre-natal vitamins daily and walking for exercise. Discussed self-help for nausea, avoiding OTC medications until consulting provider or pharmacist, other than Tylenol as needed, minimal caffeine (1-2 cups daily) and avoiding alcohol. She will schedule her initial OB visit within the next month. Feel free to call with any questions.  - History of miscarriage, will order pelvic ultrasound to confirm viability - Fibroid uterus, last noted to be small in June, ~ 3 cm. Will follow in pregnancy.    Rubie Maid, MD [facility]

## 2021-03-30 NOTE — Patient Instructions (Signed)

## 2021-04-19 ENCOUNTER — Other Ambulatory Visit: Payer: Self-pay

## 2021-04-19 ENCOUNTER — Ambulatory Visit (INDEPENDENT_AMBULATORY_CARE_PROVIDER_SITE_OTHER): Payer: PRIVATE HEALTH INSURANCE | Admitting: Obstetrics and Gynecology

## 2021-04-19 VITALS — BP 106/63 | HR 81 | Resp 16 | Ht <= 58 in | Wt 138.2 lb

## 2021-04-19 DIAGNOSIS — Z113 Encounter for screening for infections with a predominantly sexual mode of transmission: Secondary | ICD-10-CM | POA: Diagnosis not present

## 2021-04-19 DIAGNOSIS — Z3A12 12 weeks gestation of pregnancy: Secondary | ICD-10-CM | POA: Diagnosis not present

## 2021-04-19 DIAGNOSIS — Z3481 Encounter for supervision of other normal pregnancy, first trimester: Secondary | ICD-10-CM

## 2021-04-19 DIAGNOSIS — O99210 Obesity complicating pregnancy, unspecified trimester: Secondary | ICD-10-CM

## 2021-04-19 DIAGNOSIS — O99211 Obesity complicating pregnancy, first trimester: Secondary | ICD-10-CM

## 2021-04-19 LAB — OB RESULTS CONSOLE VARICELLA ZOSTER ANTIBODY, IGG: Varicella: IMMUNE

## 2021-04-19 NOTE — Progress Notes (Signed)
Emily Wolf presents for [place] nurse interview visit. Pregnancy confirmation done 03/30/2021.  G2P0010. Pregnancy education material explained and given. No cats in the home. NOB labs ordered. TSH/HbgA1c due to Increased BMI. Body mass index is 30.98 kg/m.  Sickle cell ordered. HIV labs and Drug screen were explained optional and she did not decline. Drug screen ordered. PNV encouraged. Genetic screening options discussed. Genetic testing: Ordered. She does not want to know the results. Put results in an envelope and place at front desk for pick up. Pt may discuss with provider.  Financial policy reviewed. FMLA form reviewed and signed. Pt. To follow up with [place] in 1.1 weeks for NOB physical.  All questions answered.

## 2021-04-20 ENCOUNTER — Encounter: Payer: Self-pay | Admitting: Obstetrics and Gynecology

## 2021-04-20 ENCOUNTER — Ambulatory Visit (INDEPENDENT_AMBULATORY_CARE_PROVIDER_SITE_OTHER): Payer: PRIVATE HEALTH INSURANCE

## 2021-04-20 DIAGNOSIS — Z8759 Personal history of other complications of pregnancy, childbirth and the puerperium: Secondary | ICD-10-CM | POA: Diagnosis not present

## 2021-04-20 DIAGNOSIS — O3680X0 Pregnancy with inconclusive fetal viability, not applicable or unspecified: Secondary | ICD-10-CM

## 2021-04-20 LAB — PAIN MGT SCRN (14 DRUGS), UR
Amphetamine Scrn, Ur: NEGATIVE ng/mL
BARBITURATE SCREEN URINE: NEGATIVE ng/mL
BENZODIAZEPINE SCREEN, URINE: NEGATIVE ng/mL
Buprenorphine, Urine: NEGATIVE ng/mL
CANNABINOIDS UR QL SCN: NEGATIVE ng/mL
Cocaine (Metab) Scrn, Ur: NEGATIVE ng/mL
Creatinine(Crt), U: 219.9 mg/dL (ref 20.0–300.0)
Fentanyl, Urine: NEGATIVE pg/mL
Meperidine Screen, Urine: NEGATIVE ng/mL
Methadone Screen, Urine: NEGATIVE ng/mL
OXYCODONE+OXYMORPHONE UR QL SCN: NEGATIVE ng/mL
Opiate Scrn, Ur: NEGATIVE ng/mL
Ph of Urine: 5.5 (ref 4.5–8.9)
Phencyclidine Qn, Ur: NEGATIVE ng/mL
Propoxyphene Scrn, Ur: NEGATIVE ng/mL
Tramadol Screen, Urine: NEGATIVE ng/mL

## 2021-04-20 LAB — ANTIBODY SCREEN: Antibody Screen: NEGATIVE

## 2021-04-20 LAB — RUBELLA SCREEN: Rubella Antibodies, IGG: 7.45 index (ref >0.99)

## 2021-04-20 LAB — VIRAL HEPATITIS HBV, HCV
HCV Ab: <0.1 s/co ratio (ref 0.0–0.9)
Hep B Core Total Ab: NEGATIVE
Hep B Surface Ab, Qual: REACTIVE
Hepatitis B Surface Ag: NEGATIVE

## 2021-04-20 LAB — VARICELLA ZOSTER ANTIBODY, IGG: Varicella zoster IgG: 3407 index (ref >165)

## 2021-04-20 LAB — HIV ANTIBODY (ROUTINE TESTING W REFLEX): HIV Screen 4th Generation wRfx: NONREACTIVE

## 2021-04-20 LAB — RPR: RPR Ser Ql: NONREACTIVE

## 2021-04-20 LAB — NICOTINE SCREEN, URINE: Cotinine Ql Scrn, Ur: NEGATIVE ng/mL

## 2021-04-20 LAB — HEMOGLOBIN A1C
Est. average glucose Bld gHb Est-mCnc: 100 mg/dL
Hgb A1c MFr Bld: 5.1 % (ref 4.8–5.6)

## 2021-04-20 LAB — HCV INTERPRETATION

## 2021-04-20 LAB — ABO AND RH: Rh Factor: POSITIVE

## 2021-04-20 LAB — TSH: TSH: 1.03 u[IU]/mL (ref 0.450–4.500)

## 2021-04-20 LAB — PARVOVIRUS B19 ANTIBODY, IGG AND IGM
Parvovirus B19 IgG: 4 index — ABNORMAL HIGH (ref 0.0–0.8)
Parvovirus B19 IgM: 0.1 index (ref 0.0–0.8)

## 2021-04-20 LAB — HGB SOLU + RFLX FRAC: Sickle Solubility Test - HGBRFX: NEGATIVE

## 2021-04-21 LAB — URINE CULTURE, OB REFLEX

## 2021-04-21 LAB — GC/CHLAMYDIA PROBE AMP
Chlamydia trachomatis, NAA: NEGATIVE
Neisseria Gonorrhoeae by PCR: NEGATIVE

## 2021-04-21 LAB — CULTURE, OB URINE

## 2021-04-22 LAB — URINALYSIS, ROUTINE W REFLEX MICROSCOPIC

## 2021-04-27 ENCOUNTER — Ambulatory Visit (INDEPENDENT_AMBULATORY_CARE_PROVIDER_SITE_OTHER): Payer: PRIVATE HEALTH INSURANCE | Admitting: Obstetrics and Gynecology

## 2021-04-27 ENCOUNTER — Other Ambulatory Visit: Payer: Self-pay

## 2021-04-27 VITALS — BP 108/76 | HR 99 | Ht <= 58 in | Wt 137.0 lb

## 2021-04-27 DIAGNOSIS — R638 Other symptoms and signs concerning food and fluid intake: Secondary | ICD-10-CM

## 2021-04-27 DIAGNOSIS — Z3481 Encounter for supervision of other normal pregnancy, first trimester: Secondary | ICD-10-CM

## 2021-04-27 DIAGNOSIS — Z1379 Encounter for other screening for genetic and chromosomal anomalies: Secondary | ICD-10-CM

## 2021-04-27 DIAGNOSIS — D259 Leiomyoma of uterus, unspecified: Secondary | ICD-10-CM

## 2021-04-27 DIAGNOSIS — Z3A13 13 weeks gestation of pregnancy: Secondary | ICD-10-CM

## 2021-04-27 DIAGNOSIS — Z86018 Personal history of other benign neoplasm: Secondary | ICD-10-CM

## 2021-04-27 HISTORY — DX: Leiomyoma of uterus, unspecified: D25.9

## 2021-04-27 LAB — POCT URINALYSIS DIPSTICK OB
Bilirubin, UA: NEGATIVE
Blood, UA: NEGATIVE
Glucose, UA: NEGATIVE
Ketones, UA: NEGATIVE
Leukocytes, UA: NEGATIVE
Nitrite, UA: NEGATIVE
POC,PROTEIN,UA: NEGATIVE
Spec Grav, UA: 1.02 (ref 1.010–1.025)
Urobilinogen, UA: NEGATIVE E.U./dL — AB
pH, UA: 6.5 (ref 5.0–8.0)

## 2021-04-27 NOTE — Patient Instructions (Signed)
WHAT OB PATIENTS CAN EXPECT  Confirmation of pregnancy and ultrasound ordered if medically indicated-[redacted] weeks gestation New OB (NOB) intake with nurse and New OB (NOB) labs- [redacted] weeks gestation New OB (NOB) physical examination with provider- 11/[redacted] weeks gestation Flu vaccine-[redacted] weeks gestation Anatomy scan-[redacted] weeks gestation Glucose tolerance test, blood work to test for anemia, T-dap vaccine-[redacted] weeks gestation Vaginal swabs/cultures-STD/Group B strep-[redacted] weeks gestation Appointments every 4 weeks until 28 weeks Every 2 weeks from 28 weeks until 36 weeks Weekly visits from 36 weeks until delivery    Second Trimester of Pregnancy The second trimester of pregnancy is from week 13 through week 27. This is months 4 through 6 of pregnancy. The second trimester is often a time when you feel your best. Your body has adjusted to being pregnant, and you begin to feel better physically. During the second trimester: Morning sickness has lessened or stopped completely. You may have more energy. You may have an increase in appetite. The second trimester is also a time when the unborn baby (fetus) is growing rapidly. At the end of the sixth month, the fetus may be up to 12 inches long and weigh about 1 pounds. You will likely begin to feel the baby move (quickening) between 16 and 20 weeks of pregnancy. Body changes during your second trimester Your body continues to go through many changes during your second trimester. The changes vary and generally return to normal after the baby is born. Physical changes Your weight will continue to increase. You will notice your lower abdomen bulging out. You may begin to get stretch marks on your hips, abdomen, and breasts. Your breasts will continue to grow and to become tender. Dark spots or blotches (chloasma or mask of pregnancy) may develop on your face. A dark line from your belly button to the pubic area (linea nigra) may appear. You may have changes in  your hair. These can include thickening of your hair, rapid growth, and changes in texture. Some people also have hair loss during or after pregnancy, or hair that feels dry or thin. Health changes You may develop headaches. You may have heartburn. You may develop constipation. You may develop hemorrhoids or swollen, bulging veins (varicose veins). Your gums may bleed and may be sensitive to brushing and flossing. You may urinate more often because the fetus is pressing on your bladder. You may have back pain. This is caused by: Weight gain. Pregnancy hormones that are relaxing the joints in your pelvis. A shift in weight and the muscles that support your balance. Follow these instructions at home: Medicines Follow your health care provider's instructions regarding medicine use. Specific medicines may be either safe or unsafe to take during pregnancy. Do not take any medicines unless approved by your health care provider. Take a prenatal vitamin that contains at least 600 micrograms (mcg) of folic acid. Eating and drinking Eat a healthy diet that includes fresh fruits and vegetables, whole grains, good sources of protein such as meat, eggs, or tofu, and low-fat dairy products. Avoid raw meat and unpasteurized juice, milk, and cheese. These carry germs that can harm you and your baby. You may need to take these actions to prevent or treat constipation: Drink enough fluid to keep your urine pale yellow. Eat foods that are high in fiber, such as beans, whole grains, and fresh fruits and vegetables. Limit foods that are high in fat and processed sugars, such as fried or sweet foods. Activity Exercise only as directed by your  health care provider. Most people can continue their usual exercise routine during pregnancy. Try to exercise for 30 minutes at least 5 days a week. Stop exercising if you develop contractions in your uterus. Stop exercising if you develop pain or cramping in the lower  abdomen or lower back. Avoid exercising if it is very hot or humid or if you are at a high altitude. Avoid heavy lifting. If you choose to, you may have sex unless your health care provider tells you not to. Relieving pain and discomfort Wear a supportive bra to prevent discomfort from breast tenderness. Take warm sitz baths to soothe any pain or discomfort caused by hemorrhoids. Use hemorrhoid cream if your health care provider approves. Rest with your legs raised (elevated) if you have leg cramps or low back pain. If you develop varicose veins: Wear support hose as told by your health care provider. Elevate your feet for 15 minutes, 3-4 times a day. Limit salt in your diet. Safety Wear your seat belt at all times when driving or riding in a car. Talk with your health care provider if someone is verbally or physically abusive to you. Lifestyle Do not use hot tubs, steam rooms, or saunas. Do not douche. Do not use tampons or scented sanitary pads. Avoid cat litter boxes and soil used by cats. These carry germs that can cause birth defects in the baby and possibly loss of the fetus by miscarriage or stillbirth. Do not use herbal remedies, alcohol, illegal drugs, or medicines that are not approved by your health care provider. Chemicals in these products can harm your baby. Do not use any products that contain nicotine or tobacco, such as cigarettes, e-cigarettes, and chewing tobacco. If you need help quitting, ask your health care provider. General instructions During a routine prenatal visit, your health care provider will do a physical exam and other tests. He or she will also discuss your overall health. Keep all follow-up visits. This is important. Ask your health care provider for a referral to a local prenatal education class. Ask for help if you have counseling or nutritional needs during pregnancy. Your health care provider can offer advice or refer you to specialists for help with  various needs. Where to find more information American Pregnancy Association: americanpregnancy.[place] and Gynecologists: PoolDevices.com.pt Office on [employer] Health: KeywordPortfolios.com.br Contact a health care provider if you have: A headache that does not go away when you take medicine. Vision changes or you see spots in front of your eyes. Mild pelvic cramps, pelvic pressure, or nagging pain in the abdominal area. Persistent nausea, vomiting, or diarrhea. A bad-smelling vaginal discharge or foul-smelling urine. Pain when you urinate. Sudden or extreme swelling of your face, hands, ankles, feet, or legs. A fever. Get help right away if you: Have fluid leaking from your vagina. Have spotting or bleeding from your vagina. Have severe abdominal cramping or pain. Have difficulty breathing. Have chest pain. Have fainting spells. Have not felt your baby move for the time period told by your health care provider. Have new or increased pain, swelling, or redness in an arm or leg. Summary The second trimester of pregnancy is from week 13 through week 27 (months 4 through 6). Do not use herbal remedies, alcohol, illegal drugs, or medicines that are not approved by your health care provider. Chemicals in these products can harm your baby. Exercise only as directed by your health care provider. Most people can continue their usual exercise routine during  pregnancy. Keep all follow-up visits. This is important. This information is not intended to replace advice given to you by your health care provider. Make sure you discuss any questions you have with your health care provider. Document Revised: 08/28/2019 Document Reviewed: 07/04/2019 Elsevier Patient Education  2022 [employer].

## 2021-04-27 NOTE — Progress Notes (Signed)
OBSTETRIC INITIAL PRENATAL VISIT  Subjective:    Emily Wolf is being seen today for her first obstetrical visit.  This is a planned pregnancy. She is a 36 y.o. G2P0010 female at [redacted]w[redacted]d gestation, Estimated Date of Delivery: 10/29/21 with Patient's last menstrual period was 01/22/2021 (exact date).,  consistent with 13 week sono. Her obstetrical history is significant for obesity and history of miscarriage. Also has a h/o small fibroid . Relationship with FOB: spouse, living together. Patient does intend to breast feed. Pregnancy history fully reviewed.    OB History  Gravida Para Term Preterm AB Living  2 0 0 0 1 0  SAB IAB Ectopic Multiple Live Births  1 0 0 0 0    # Outcome Date GA Lbr Len/2nd Weight Sex Delivery Anes PTL Lv  2 Current           1 SAB 09/2020            Gynecologic History:  Last pap smear was ~ 2 years ago.  Results were Normal. Denies h/o abnormal pap smears in the past.  Denies history of STIs.  Contraception prior to conception: None   Past Medical History:  Diagnosis Date   Aspirin-exacerbated respiratory disease (AERD)    Asthma    Kidney stone    Miscarriage     Family History  Problem Relation Age of Onset   Asthma Mother    Healthy Father    Asthma Sister    Sickle cell trait Sister    Diabetes Maternal Grandmother    Breast cancer Neg Hx    Cervical cancer Neg Hx     Past Surgical History:  Procedure Laterality Date   EXTRACORPOREAL SHOCK WAVE LITHOTRIPSY Right 05/28/2020   Procedure: EXTRACORPOREAL SHOCK WAVE LITHOTRIPSY (ESWL);  Surgeon: Abbie Sons, MD;  Location: [facility] ORS;  Service: Urology;  Laterality: Right;   NASAL ENDOSCOPY N/A    NASAL SINUS SURGERY      Social History   Socioeconomic History   Marital status: Single    Spouse name: Not on file   Number of children: Not on file   Years of education: Not on file   Highest education level: Not on file  Occupational History   Not on file  Tobacco Use    Smoking status: Never    Passive exposure: Never   Smokeless tobacco: Never  Vaping Use   Vaping Use: Never used  Substance and Sexual Activity   Alcohol use: Not Currently   Drug use: Never   Sexual activity: Yes  Other Topics Concern   Not on file  Social History Narrative   Not on file   Social Determinants of Health   Financial Resource Strain: Not on file  Food Insecurity: Not on file  Transportation Needs: Not on file  Physical Activity: Not on file  Stress: Not on file  Social Connections: Not on file  Intimate Partner Violence: Not on file    Current Outpatient Medications on File Prior to Visit  Medication Sig Dispense Refill   albuterol (VENTOLIN HFA) 108 (90 Base) MCG/ACT inhaler Inhale 2 puffs into the lungs every 6 (six) hours as needed for wheezing or shortness of breath.     Fluticasone-Salmeterol (ADVAIR) 250-50 MCG/DOSE AEPB Inhale 1 puff into the lungs 2 (two) times daily.     Phenylephrine HCl (AFRIN ALLERGY NA) Place into the nose. Taking for 3 days at a time     Prenatal Vit-Fe Fumarate-FA (PRENATAL PO) Take  by mouth.     No current facility-administered medications on file prior to visit.    Allergies  Allergen Reactions   Aspirin Shortness Of Breath   Ibuprofen Shortness Of Breath   Nsaids Shortness Of Breath   Augmentin [Amoxicillin-Pot Clavulanate] Hives     Review of Systems General: Not Present- Fever, Weight Loss and Weight Gain. Skin: Not Present- Rash. HEENT: Not Present- Blurred Vision, Headache and Bleeding Gums. Respiratory: Not Present- Difficulty Breathing. Breast: Not Present- Breast Mass. Cardiovascular: Not Present- Chest Pain, Elevated Blood Pressure, Fainting / Blacking Out and Shortness of Breath. Gastrointestinal: Not Present- Abdominal Pain, Constipation, Nausea and Vomiting. Female Genitourinary: Not Present- Frequency, Painful Urination, Pelvic Pain, Vaginal Bleeding, Vaginal Discharge, Contractions, regular, Fetal  Movements Decreased, Urinary Complaints and Vaginal Fluid. Musculoskeletal: Not Present- Back Pain and Leg Cramps. Neurological: Not Present- Dizziness. Psychiatric: Not Present- Depression.     Objective:   Blood pressure 108/76, pulse 99, weight 137 lb (62.1 kg), last menstrual period 01/22/2021, unknown if currently breastfeeding.  Body mass index is 30.71 kg/m.  General Appearance:    Alert, cooperative, no distress, appears stated age  Head:    Normocephalic, without obvious abnormality, atraumatic  Eyes:    PERRL, conjunctiva/corneas clear, EOM's intact, both eyes  Ears:    Normal external ear canals, both ears  Nose:   Nares normal, septum midline, mucosa normal, no drainage or sinus tenderness  Throat:   Lips, mucosa, and tongue normal; teeth and gums normal  Neck:   Supple, symmetrical, trachea midline, no adenopathy; thyroid: no enlargement/tenderness/nodules; no carotid bruit or JVD  Back:     Symmetric, no curvature, ROM normal, no CVA tenderness  Lungs:     Clear to auscultation bilaterally, respirations unlabored  Chest Wall:    No tenderness or deformity   Heart:    Regular rate and rhythm, S1 and S2 normal, no murmur, rub or gallop  Breast Exam:    No tenderness, masses, or nipple abnormality  Abdomen:     Soft, non-tender, bowel sounds active all four quadrants, no masses, no organomegaly.  FHT 148 bpm.  Genitalia:    deferred  Extremities:   Extremities normal, atraumatic, no cyanosis or edema  Pulses:   2+ and symmetric all extremities  Skin:   Skin color, texture, turgor normal, no rashes or lesions  Lymph nodes:   Cervical, supraclavicular, and axillary nodes normal  Neurologic:   CNII-XII intact, normal strength, sensation and reflexes throughout     Assessment:   1. Encounter for supervision of other normal pregnancy in first trimester   2. [redacted] weeks gestation of pregnancy   3. History of uterine fibroid   4. Genetic screening   5. Increased BMI      Plan:   Supervision of normal risk pregnancy  - Initial labs reviewed. - Prenatal vitamins encouraged. - Problem list reviewed and updated. - New OB counseling:  The patient has been given an overview regarding routine prenatal care.  - Prenatal testing, optional genetic testing, and ultrasound use in pregnancy were reviewed.  Traditional genetic screening vs cell-fee DNA genetic screening discussed, including risks and benefits. Testing ordered.  For AFP next visit. - Benefits of Breast Feeding were discussed. The patient is encouraged to consider nursing her baby post partum.  2. Obesity in pregnancy (Increased BMI) -  Recommendations regarding diet, weight gain, and exercise in pregnancy were given. - Patient not a candidate for aspirin as she has allergy.  3. History of  uterine fibroid. - History of fibroid uterus, not visualized on recent scan. Will continue to monitor during pregnancy. If no significant growth by anatomy scan, no further follow up needed.    Follow up in 4 weeks.   Rubie Maid, MD [facility]

## 2021-05-01 LAB — MATERNIT21  PLUS CORE+ESS+SCA, BLOOD

## 2021-05-27 ENCOUNTER — Other Ambulatory Visit: Payer: Self-pay

## 2021-05-27 ENCOUNTER — Encounter: Payer: Self-pay | Admitting: Obstetrics and Gynecology

## 2021-05-27 ENCOUNTER — Ambulatory Visit (INDEPENDENT_AMBULATORY_CARE_PROVIDER_SITE_OTHER): Payer: PRIVATE HEALTH INSURANCE | Admitting: Obstetrics and Gynecology

## 2021-05-27 VITALS — BP 119/52 | HR 80 | Ht <= 58 in | Wt 140.0 lb

## 2021-05-27 DIAGNOSIS — Z3A17 17 weeks gestation of pregnancy: Secondary | ICD-10-CM

## 2021-05-27 DIAGNOSIS — Z1379 Encounter for other screening for genetic and chromosomal anomalies: Secondary | ICD-10-CM

## 2021-05-27 DIAGNOSIS — Z3402 Encounter for supervision of normal first pregnancy, second trimester: Secondary | ICD-10-CM

## 2021-05-27 LAB — POCT URINALYSIS DIPSTICK OB
Bilirubin, UA: NEGATIVE
Blood, UA: NEGATIVE
Glucose, UA: NEGATIVE
Ketones, UA: NEGATIVE
Leukocytes, UA: NEGATIVE
Nitrite, UA: NEGATIVE
POC,PROTEIN,UA: NEGATIVE
Spec Grav, UA: 1.015 (ref 1.010–1.025)
Urobilinogen, UA: 0.2 E.U./dL
pH, UA: 6.5 (ref 5.0–8.0)

## 2021-05-27 NOTE — Progress Notes (Signed)
ROB. Patient states she  has been having frequent headaches and using tylenol with slight relief.  AFP ordered and to be completed today. Patient states no questions or concerns at this time.

## 2021-05-27 NOTE — Progress Notes (Signed)
ROB: Doing well.  Has occasional headaches relieved by Tylenol.  I have reassured her that she may take some Tylenol during pregnancy.  We discussed increasing hydration as well.  Patient desires AFP today.  Ultrasound next visit.

## 2021-05-31 LAB — AFP, SERUM, OPEN SPINA BIFIDA
AFP MoM: 1.6
AFP Value: 82.9 ng/mL
Gest. Age on Collection Date: 17.9 weeks
Maternal Age At EDD: 34.9 yr
OSBR Risk 1 IN: 4200
Test Results:: NEGATIVE
Weight: 140 [lb_av]

## 2021-06-23 ENCOUNTER — Ambulatory Visit (INDEPENDENT_AMBULATORY_CARE_PROVIDER_SITE_OTHER): Payer: PRIVATE HEALTH INSURANCE | Admitting: Obstetrics and Gynecology

## 2021-06-23 ENCOUNTER — Other Ambulatory Visit: Payer: Self-pay

## 2021-06-23 ENCOUNTER — Ambulatory Visit (INDEPENDENT_AMBULATORY_CARE_PROVIDER_SITE_OTHER): Payer: PRIVATE HEALTH INSURANCE

## 2021-06-23 ENCOUNTER — Encounter: Payer: Self-pay | Admitting: Obstetrics and Gynecology

## 2021-06-23 VITALS — BP 107/71 | HR 76

## 2021-06-23 DIAGNOSIS — Z3402 Encounter for supervision of normal first pregnancy, second trimester: Secondary | ICD-10-CM

## 2021-06-23 DIAGNOSIS — Z3482 Encounter for supervision of other normal pregnancy, second trimester: Secondary | ICD-10-CM | POA: Diagnosis not present

## 2021-06-23 DIAGNOSIS — Z3A21 21 weeks gestation of pregnancy: Secondary | ICD-10-CM

## 2021-06-23 DIAGNOSIS — Z3A17 17 weeks gestation of pregnancy: Secondary | ICD-10-CM

## 2021-06-23 DIAGNOSIS — N898 Other specified noninflammatory disorders of vagina: Secondary | ICD-10-CM

## 2021-06-23 LAB — POCT URINALYSIS DIPSTICK OB
Bilirubin, UA: NEGATIVE
Blood, UA: NEGATIVE
Glucose, UA: NEGATIVE
Ketones, UA: NEGATIVE
Leukocytes, UA: NEGATIVE
Nitrite, UA: NEGATIVE
POC,PROTEIN,UA: NEGATIVE
Spec Grav, UA: 1.01 (ref 1.010–1.025)
Urobilinogen, UA: 0.2 E.U./dL
pH, UA: 7.5 (ref 5.0–8.0)

## 2021-06-23 NOTE — Progress Notes (Signed)
ROB: Doing well. Does note occasional mild body odor in vaginal area. Desires to take natural supplements. Also recommended Vagisil. Denies vaginal discharge, itching, or burning. S/p normal anatomy scan. RTC in 4 weeks.  ?

## 2021-06-23 NOTE — Progress Notes (Signed)
ROB: She is doing well today, she has no new concerns. Ultrasound done today. ?

## 2021-07-06 ENCOUNTER — Encounter: Payer: Self-pay | Admitting: Obstetrics and Gynecology

## 2021-07-21 ENCOUNTER — Ambulatory Visit (INDEPENDENT_AMBULATORY_CARE_PROVIDER_SITE_OTHER): Payer: PRIVATE HEALTH INSURANCE | Admitting: Obstetrics and Gynecology

## 2021-07-21 ENCOUNTER — Encounter: Payer: Self-pay | Admitting: Obstetrics and Gynecology

## 2021-07-21 VITALS — BP 114/75 | HR 85 | Wt 145.0 lb

## 2021-07-21 DIAGNOSIS — Z3A25 25 weeks gestation of pregnancy: Secondary | ICD-10-CM

## 2021-07-21 DIAGNOSIS — Z3482 Encounter for supervision of other normal pregnancy, second trimester: Secondary | ICD-10-CM

## 2021-07-21 LAB — POCT URINALYSIS DIPSTICK OB
Bilirubin, UA: NEGATIVE
Blood, UA: NEGATIVE
Glucose, UA: NEGATIVE
Ketones, UA: NEGATIVE
Leukocytes, UA: NEGATIVE
Nitrite, UA: NEGATIVE
POC,PROTEIN,UA: NEGATIVE
Spec Grav, UA: 1.015 (ref 1.010–1.025)
Urobilinogen, UA: 0.2 E.U./dL
pH, UA: 6.5 (ref 5.0–8.0)

## 2021-07-21 NOTE — Progress Notes (Signed)
ROB. Patient states fetal movement with no pain or pressure. She states has felt less movement as before but she believes her baby moves more at night while she sleeps. Fetal movement counting handout sent via mychart. She states a recent fall but did not hit her stomach. Patient states no questions or concerns at this time.   ?

## 2021-07-21 NOTE — Progress Notes (Signed)
ROB: Now feeling baby move.  She had a fall several days ago.  Denies bleeding-feels appropriate fetal movement.  1 hour GCT next visit. ?

## 2021-08-01 ENCOUNTER — Encounter: Payer: Self-pay | Admitting: Obstetrics and Gynecology

## 2021-08-09 NOTE — Progress Notes (Signed)
? ? ? ?  Subjective:  ? ? Emily Wolf is a 36 y.o. G76P0010 female being seen today for evaluation of oral thrush.  She is at 48w4dgestation. Patient reports  whitening of her tongue x 2 weeks.  Also has noted vaginal yeast infection . Fetal movement: normal. ? ? ?The following portions of the patient's history were reviewed and updated as appropriate: allergies, current medications, past family history, past medical history, past social history, past surgical history, and problem list. ? ?Review of Systems ?Pertinent items are noted in HPI.  ? ?Objective:  ? ? BP (!) 113/55   Pulse 99   Resp 16   Ht 4' 8.75" (1.441 m)   Wt 153 lb 3.2 oz (69.5 kg)   LMP 01/22/2021 (Exact Date)   BMI 33.44 kg/m?  ?FHT: 154 BPM  ?Uterine Size: size equals dates  ?  ? ?Assessment:  ? ? Pregnancy 28 and 4/7 weeks  ? Oral and vaginal candidiasis ? ?Plan:  ? ?-  Oral and vaginal candidiasis ,  prescription for Diflucan given. Discussed that this most likely developed from the use of her inhalers.  ?- Follow up in 1 weeks for routine OB visit.  For 28 week labs then.   ? ? ?CRubie Maid MD ?[facility] ? ?

## 2021-08-10 ENCOUNTER — Ambulatory Visit (INDEPENDENT_AMBULATORY_CARE_PROVIDER_SITE_OTHER): Payer: PRIVATE HEALTH INSURANCE | Admitting: Obstetrics and Gynecology

## 2021-08-10 ENCOUNTER — Encounter: Payer: Self-pay | Admitting: Obstetrics and Gynecology

## 2021-08-10 VITALS — BP 113/55 | HR 99 | Resp 16 | Ht <= 58 in | Wt 153.2 lb

## 2021-08-10 DIAGNOSIS — B370 Candidal stomatitis: Secondary | ICD-10-CM | POA: Diagnosis not present

## 2021-08-10 DIAGNOSIS — B3731 Acute candidiasis of vulva and vagina: Secondary | ICD-10-CM

## 2021-08-10 MED ORDER — FLUCONAZOLE 100 MG PO TABS: 100.0000 mg | ORAL_TABLET | Freq: Every day | ORAL | 0 refills | Status: DC

## 2021-08-11 IMAGING — CR DG ABDOMEN 1V
1 series · 1 of 1 positions shown · non-contrast
Comparison: 05/19/2020, 05/28/2020

CLINICAL DATA: Previous right ureteral 3 mm calculus 05/19/2020

EXAM:
ABDOMEN - 1 VIEW

[dg abd 1 view]
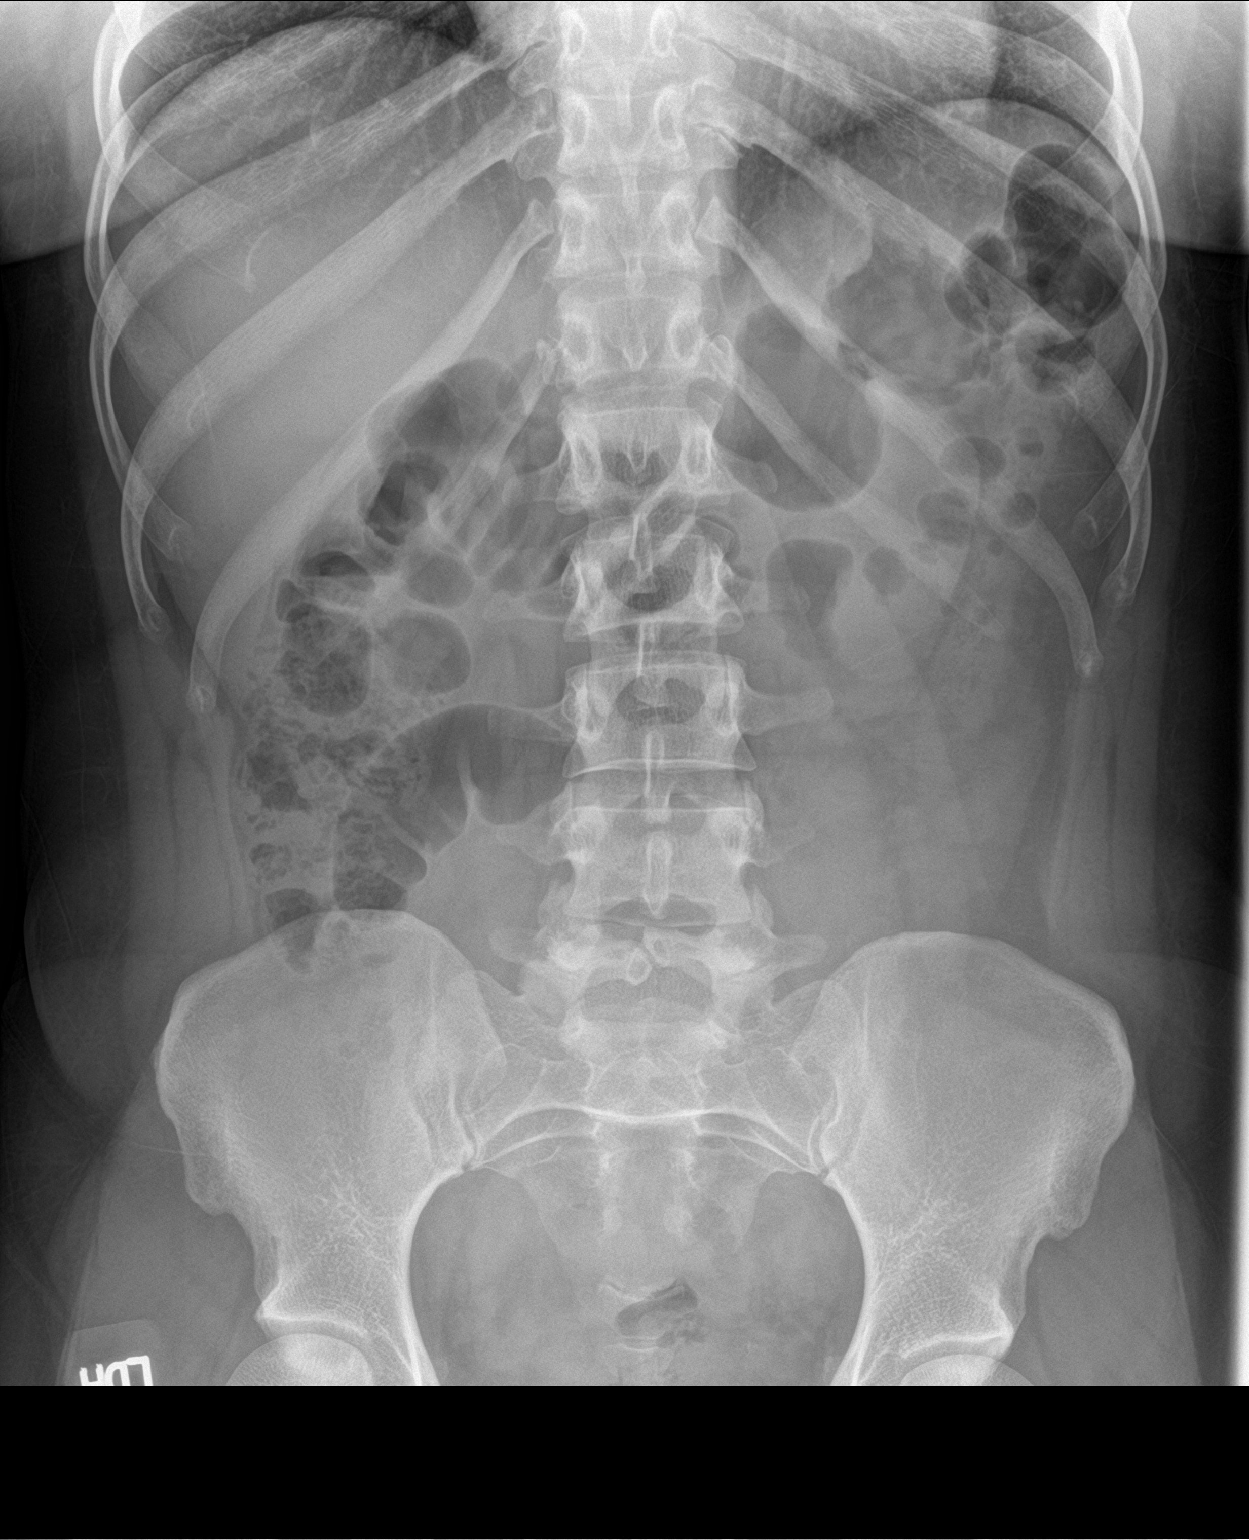

[1 of 1 positions shown; findings below may reference images not displayed]

FINDINGS: Stool and gas pattern obscures the renal shadows, worse on the
right. No definite radiopaque calculi by plain radiography.

Nonobstructive bowel gas pattern.

Lung bases are clear.  No acute osseous finding.
IMPRESSION: No significant abnormality by plain radiography.

Previous proximal right ureteral calculus by CT may not be
visualized by plain radiography.

## 2021-08-16 ENCOUNTER — Other Ambulatory Visit: Payer: Self-pay

## 2021-08-16 DIAGNOSIS — Z3A29 29 weeks gestation of pregnancy: Secondary | ICD-10-CM

## 2021-08-16 DIAGNOSIS — Z3483 Encounter for supervision of other normal pregnancy, third trimester: Secondary | ICD-10-CM

## 2021-08-16 DIAGNOSIS — Z131 Encounter for screening for diabetes mellitus: Secondary | ICD-10-CM

## 2021-08-16 DIAGNOSIS — Z113 Encounter for screening for infections with a predominantly sexual mode of transmission: Secondary | ICD-10-CM

## 2021-08-18 ENCOUNTER — Ambulatory Visit (INDEPENDENT_AMBULATORY_CARE_PROVIDER_SITE_OTHER): Payer: PRIVATE HEALTH INSURANCE | Admitting: Obstetrics and Gynecology

## 2021-08-18 ENCOUNTER — Encounter: Payer: Self-pay | Admitting: Obstetrics and Gynecology

## 2021-08-18 ENCOUNTER — Other Ambulatory Visit: Payer: PRIVATE HEALTH INSURANCE

## 2021-08-18 VITALS — BP 113/66 | HR 78 | Wt 151.3 lb

## 2021-08-18 DIAGNOSIS — Z113 Encounter for screening for infections with a predominantly sexual mode of transmission: Secondary | ICD-10-CM

## 2021-08-18 DIAGNOSIS — Z3A29 29 weeks gestation of pregnancy: Secondary | ICD-10-CM | POA: Diagnosis not present

## 2021-08-18 DIAGNOSIS — Z3483 Encounter for supervision of other normal pregnancy, third trimester: Secondary | ICD-10-CM | POA: Diagnosis not present

## 2021-08-18 DIAGNOSIS — B370 Candidal stomatitis: Secondary | ICD-10-CM

## 2021-08-18 DIAGNOSIS — Z23 Encounter for immunization: Secondary | ICD-10-CM | POA: Diagnosis not present

## 2021-08-18 DIAGNOSIS — Z131 Encounter for screening for diabetes mellitus: Secondary | ICD-10-CM

## 2021-08-18 LAB — POCT URINALYSIS DIPSTICK OB
Bilirubin, UA: NEGATIVE
Blood, UA: NEGATIVE
Glucose, UA: NEGATIVE
Ketones, UA: NEGATIVE
Leukocytes, UA: NEGATIVE
Nitrite, UA: NEGATIVE
POC,PROTEIN,UA: NEGATIVE
Spec Grav, UA: 1.01 (ref 1.010–1.025)
Urobilinogen, UA: 0.2 E.U./dL
pH, UA: 6 (ref 5.0–8.0)

## 2021-08-18 MED ORDER — NYSTATIN 100000 UNIT/ML MT SUSP: 5.0000 mL | Freq: Four times a day (QID) | OROMUCOSAL | 0 refills | Status: DC

## 2021-08-18 NOTE — Progress Notes (Signed)
ROB: Patient still noting thrush present on tongue. Is taking Diflucan as prescribed. Will also prescribe Nystatin mouth rinse. For 28 week labs today.  Plans to  plans to breastfeed, desires Progesterone only pills for contraception. For Tdap today, signed blood consent.  RTC in 2 weeks.  ?

## 2021-08-18 NOTE — Progress Notes (Signed)
ROB.Patient states no questions or concerns at this time.  

## 2021-08-19 LAB — CBC WITH DIFFERENTIAL/PLATELET
Basophils Absolute: 0 10*3/uL (ref 0.0–0.2)
Basos: 0 %
EOS (ABSOLUTE): 0.6 10*3/uL — ABNORMAL HIGH (ref 0.0–0.4)
Eos: 6 %
Hematocrit: 37 % (ref 34.0–46.6)
Hemoglobin: 11.9 g/dL (ref 11.1–15.9)
Immature Grans (Abs): 0.1 10*3/uL (ref 0.0–0.1)
Immature Granulocytes: 1 %
Lymphocytes Absolute: 1.7 10*3/uL (ref 0.7–3.1)
Lymphs: 17 %
MCH: 27.7 pg (ref 26.6–33.0)
MCHC: 32.2 g/dL (ref 31.5–35.7)
MCV: 86 fL (ref 79–97)
Monocytes Absolute: 0.6 10*3/uL (ref 0.1–0.9)
Monocytes: 6 %
Neutrophils Absolute: 6.9 10*3/uL (ref 1.4–7.0)
Neutrophils: 70 %
Platelets: 323 10*3/uL (ref 150–450)
RBC: 4.3 x10E6/uL (ref 3.77–5.28)
RDW: 13.5 % (ref 11.7–15.4)
WBC: 9.8 10*3/uL (ref 3.4–10.8)

## 2021-08-19 LAB — RPR: RPR Ser Ql: NONREACTIVE

## 2021-08-19 LAB — GLUCOSE TOLERANCE, 1 HOUR: Glucose, 1Hr PP: 188 mg/dL (ref 70–199)

## 2021-09-01 ENCOUNTER — Ambulatory Visit (INDEPENDENT_AMBULATORY_CARE_PROVIDER_SITE_OTHER): Payer: PRIVATE HEALTH INSURANCE | Admitting: Obstetrics and Gynecology

## 2021-09-01 ENCOUNTER — Encounter: Payer: Self-pay | Admitting: Obstetrics and Gynecology

## 2021-09-01 VITALS — BP 100/69 | HR 90 | Wt 154.0 lb

## 2021-09-01 DIAGNOSIS — B370 Candidal stomatitis: Secondary | ICD-10-CM

## 2021-09-01 DIAGNOSIS — Z3A31 31 weeks gestation of pregnancy: Secondary | ICD-10-CM

## 2021-09-01 DIAGNOSIS — Z3483 Encounter for supervision of other normal pregnancy, third trimester: Secondary | ICD-10-CM

## 2021-09-01 LAB — POCT URINALYSIS DIPSTICK OB
Bilirubin, UA: NEGATIVE
Blood, UA: NEGATIVE
Glucose, UA: NEGATIVE
Ketones, UA: NEGATIVE
Leukocytes, UA: NEGATIVE
Nitrite, UA: NEGATIVE
Spec Grav, UA: 1.02 (ref 1.010–1.025)
Urobilinogen, UA: 0.2 E.U./dL
pH, UA: 6 (ref 5.0–8.0)

## 2021-09-01 NOTE — Progress Notes (Signed)
ROB: Reports no problems with the baby.  Daily fetal movement.  States that she feels like she still might have some yeast in her mouth. Ritta Slot) has been treated with both nystatin mouthwash and Diflucan without success.  Patient also complaining of nasal discharge and says she regularly sees an ENT.  She plans to make an appointment with her ENT for follow-up of the discharge and her persistent thrush.

## 2021-09-01 NOTE — Progress Notes (Signed)
ROB. Patient states fetal movement, no pain or pressure at this time. She states she feels her previous thrush is still present, states cotton-like feeling in mouth. Patient states concerns of sinus congestion and would like to discuss treatment methods or if she needs to see ENT.  Patient states no questions or concerns at this time.

## 2021-09-15 ENCOUNTER — Encounter: Payer: PRIVATE HEALTH INSURANCE | Admitting: Obstetrics and Gynecology

## 2021-09-28 ENCOUNTER — Ambulatory Visit (INDEPENDENT_AMBULATORY_CARE_PROVIDER_SITE_OTHER): Payer: PRIVATE HEALTH INSURANCE | Admitting: Obstetrics and Gynecology

## 2021-09-28 ENCOUNTER — Encounter: Payer: Self-pay | Admitting: Obstetrics and Gynecology

## 2021-09-28 VITALS — BP 109/75 | HR 94 | Wt 159.1 lb

## 2021-09-28 DIAGNOSIS — Z113 Encounter for screening for infections with a predominantly sexual mode of transmission: Secondary | ICD-10-CM

## 2021-09-28 DIAGNOSIS — H538 Other visual disturbances: Secondary | ICD-10-CM

## 2021-09-28 DIAGNOSIS — Z3483 Encounter for supervision of other normal pregnancy, third trimester: Secondary | ICD-10-CM

## 2021-09-28 DIAGNOSIS — Z3A35 35 weeks gestation of pregnancy: Secondary | ICD-10-CM

## 2021-09-28 DIAGNOSIS — Z3685 Encounter for antenatal screening for Streptococcus B: Secondary | ICD-10-CM

## 2021-09-28 LAB — POCT URINALYSIS DIPSTICK
Bilirubin, UA: NEGATIVE
Blood, UA: NEGATIVE
Glucose, UA: NEGATIVE
Ketones, UA: NEGATIVE
Leukocytes, UA: NEGATIVE
Nitrite, UA: NEGATIVE
Protein, UA: POSITIVE — AB
Spec Grav, UA: 1.02 (ref 1.010–1.025)
Urobilinogen, UA: 0.2 E.U./dL
pH, UA: 6 (ref 5.0–8.0)

## 2021-09-28 NOTE — Progress Notes (Signed)
ROB. Patient states fetal movement with pressure. She states her thrush has resolved, now finishing cream for yeast infection. Patient states she feels a cloudiness in her eye, states she does not wear contacts. GC/CT and GBS cultures ordered. Patient states no questions or concerns at this time.

## 2021-09-30 LAB — STREP GP B NAA: Strep Gp B NAA: NEGATIVE

## 2021-09-30 LAB — GC/CHLAMYDIA PROBE AMP
Chlamydia trachomatis, NAA: NEGATIVE
Neisseria Gonorrhoeae by PCR: NEGATIVE

## 2021-10-06 ENCOUNTER — Encounter: Payer: Self-pay | Admitting: Obstetrics

## 2021-10-06 ENCOUNTER — Ambulatory Visit (INDEPENDENT_AMBULATORY_CARE_PROVIDER_SITE_OTHER): Payer: PRIVATE HEALTH INSURANCE | Admitting: Obstetrics

## 2021-10-06 VITALS — BP 124/83 | HR 116 | Wt 150.8 lb

## 2021-10-06 DIAGNOSIS — Z3A36 36 weeks gestation of pregnancy: Secondary | ICD-10-CM

## 2021-10-06 DIAGNOSIS — R739 Hyperglycemia, unspecified: Secondary | ICD-10-CM

## 2021-10-06 LAB — POCT URINALYSIS DIPSTICK OB
Bilirubin, UA: NEGATIVE
Ketones, UA: NEGATIVE
Leukocytes, UA: NEGATIVE
Nitrite, UA: NEGATIVE
POC,PROTEIN,UA: NEGATIVE
Spec Grav, UA: 1.015 (ref 1.010–1.025)
Urobilinogen, UA: 0.2 E.U./dL
pH, UA: 6.5 (ref 5.0–8.0)

## 2021-10-06 NOTE — Progress Notes (Signed)
ROB at [redacted]w[redacted]d Active baby. TLakithahas had some menstrual-like cramping that goes away with rest, but no regular contractions. Denies LOF and vaginal bleeding. Glucose noted in urine today. Elevated 1-hour glucose on 08/18/21. Will do HgbA1C and random glucose today. Discussed possible GDM and managing diet. S>D today. Growth scan/AFI ordered. Labor encouragement handout, list of pediatricians, and doula handout given. Reviewed signs of labor and when to go to the hospital. RTC in one week.  MLloyd Huger CNM

## 2021-10-07 ENCOUNTER — Telehealth: Payer: Self-pay | Admitting: Obstetrics and Gynecology

## 2021-10-07 LAB — GLUCOSE, RANDOM: Glucose: 151 mg/dL — ABNORMAL HIGH (ref 70–99)

## 2021-10-07 LAB — HEMOGLOBIN A1C
Est. average glucose Bld gHb Est-mCnc: 137 mg/dL
Hgb A1c MFr Bld: 6.4 % — ABNORMAL HIGH (ref 4.8–5.6)

## 2021-10-07 NOTE — Telephone Encounter (Signed)
Making pt aware of [place] apt

## 2021-10-08 ENCOUNTER — Encounter: Payer: Self-pay | Admitting: Obstetrics

## 2021-10-08 ENCOUNTER — Other Ambulatory Visit: Payer: Self-pay | Admitting: Obstetrics

## 2021-10-08 MED ORDER — BLOOD GLUCOSE MONITOR KIT: PACK | 0 refills | Status: DC

## 2021-10-12 ENCOUNTER — Ambulatory Visit
Admission: RE | Admit: 2021-10-12 | Discharge: 2021-10-12 | Disposition: A | Discharge status: Home / Self Care | Payer: 59 | Source: Ambulatory Visit | Attending: Obstetrics | Admitting: Obstetrics

## 2021-10-15 ENCOUNTER — Ambulatory Visit (INDEPENDENT_AMBULATORY_CARE_PROVIDER_SITE_OTHER): Payer: PRIVATE HEALTH INSURANCE | Admitting: Obstetrics and Gynecology

## 2021-10-15 ENCOUNTER — Encounter: Payer: Self-pay | Admitting: Obstetrics and Gynecology

## 2021-10-15 VITALS — BP 118/74 | HR 93 | Wt 161.6 lb

## 2021-10-15 DIAGNOSIS — Z3A38 38 weeks gestation of pregnancy: Secondary | ICD-10-CM

## 2021-10-15 DIAGNOSIS — O24419 Gestational diabetes mellitus in pregnancy, unspecified control: Secondary | ICD-10-CM

## 2021-10-15 DIAGNOSIS — Z3483 Encounter for supervision of other normal pregnancy, third trimester: Secondary | ICD-10-CM

## 2021-10-15 LAB — POCT URINALYSIS DIPSTICK OB
Bilirubin, UA: NEGATIVE
Blood, UA: NEGATIVE
Glucose, UA: NEGATIVE
Ketones, UA: NEGATIVE
Leukocytes, UA: NEGATIVE
Nitrite, UA: NEGATIVE
Spec Grav, UA: 1.015 (ref 1.010–1.025)
Urobilinogen, UA: 0.2 E.U./dL
pH, UA: 6 (ref 5.0–8.0)

## 2021-10-15 NOTE — Progress Notes (Signed)
ROB: She is doing well, no new concerns. 

## 2021-10-15 NOTE — Patient Instructions (Signed)
Gestational Diabetes Mellitus, Self-Care Caring for yourself after a diagnosis of gestational diabetes mellitus means keeping your blood sugar under control. This can be done through nutrition, exercise, lifestyle changes, insulin and other medicines, and support from your health care team. Your health care provider will set individualized treatment goals for you. What are the risks? If left untreated, gestational diabetes can cause problems for mother and baby. For the mother Women who get gestational diabetes are more likely to: Have labor induced and deliver early. Have problems during labor and delivery, if the baby is larger than normal. This includes difficult labor and damage to the birth canal. Have a cesarean delivery. Have problems with blood pressure, including high blood pressure and preeclampsia. Get it again if they become pregnant. Develop type 2 diabetes in the future. For the baby Gestational diabetes that is not treated can cause the baby to have: Low blood glucose (hypoglycemia). Larger-than-normal body size (macrosomia). Breathing problems. How to monitor blood glucose Check your blood glucose every day and as often as told by your health care provider. To do this: Wash your hands with soap and water for at least 20 seconds. Prick the side of your finger (not the tip) with the lancet. Use a different finger each time. Gently rub the finger until a small drop of blood appears. Follow instructions that come with your meter for inserting the test strip, applying blood to the strip, and getting the result. Write down your result and any notes. Blood glucose goals are: 95 mg/dL (5.3 mmol/L) when fasting. 140 mg/dL (7.8 mmol/L) 1 hour after a meal. 120 mg/dL (6.7 mmol/L) 2 hours after a meal. Follow these instructions at home: Medicines Take over-the-counter and prescription medicines only as told by your health care provider. If your health care provider prescribed  insulin or other diabetes medicines: Take them every day. Do not run out of insulin or other medicines. Plan ahead so you always have them available. Eating and drinking  Follow instructions from your health care provider about eating or drinking restrictions. See a diet and nutrition expert (registered dietician) to help you create an eating plan that helps control your diabetes. The foods in this plan will include: Lean proteins. Complex carbohydrates. These are carbohydrates that contain fiber, have a lot of nutrients, and are digested slowly. They include dried beans, nuts, and whole grain breads, cereals, or pasta. Fresh fruits and vegetables. Low-fat dairy products. Healthy fats. Eat healthy snacks between nutritious meals. Drink enough fluid to keep your urine pale yellow. Keep a record of the carbohydrates that you eat. To do this: Read food labels. Learn the standard serving sizes of foods. Make a sick day plan with your health care provider before you get sick. Follow this plan whenever you cannot eat or drink as usual. Activity Do exercises as told by your health care provider. Do 30 or more minutes of physical activity a day, or as much physical activity as your health care provider recommends. It may help to control blood glucose levels after a meal if you: Do 10 minutes of exercise after each meal. Start this exercise 30 minutes after the meal. If you start a new exercise or activity, work with your health care provider to adjust your insulin, other medicines, or food as needed. Lifestyle Do not drink alcohol. Do not use any products that contain nicotine or tobacco, such as cigarettes, e-cigarettes, and chewing tobacco. If you need help quitting, ask your health care provider. Learn to manage  stress. If you need help with this, ask your health care provider. Body care Keep your vaccines up to date. Practice good oral hygiene. To do this: Clean your teeth and gums two  times a day. Floss one or more times a day. Visit your dentist one or more times every 6 months. Stay at a healthy weight while you are pregnant. Your expected weight gain depends on your BMI (body mass index) before pregnancy. General instructions Talk with your health care provider about the risk for high blood pressure during pregnancy (preeclampsia and eclampsia). Share your diabetes management plan with people in your workplace, school, and household. Check your urine for ketones when sick and as told by your health care provider. Ketones are made by the liver when a lack of glucose forces the body to use fat for energy. Carry a medical alert card or wear medical alert jewelry that says you have gestational diabetes. Keep all follow-up visits. This is important. Get care after delivery Have your blood glucose level checked with an oral glucose tolerance test (OGTT) 4-12 weeks after delivery. Get screened for diabetes at least every 3 years, or as often as told by your health care provider. Where to find more information American Diabetes Association (ADA): diabetes.org Association of Diabetes Care & Education Specialists (ADCES): diabeteseducator.org Centers for Disease Control and Prevention (CDC): StoreMirror.com.cy American Pregnancy Association: americanpregnancy.org U.S. Department of Agriculture MyPlate: http://www.wilson-mendoza.org/ Contact a health care provider if: Your blood glucose is above your target for two tests in a row. You have a fever. You have been sick for 2 days or more and are not getting better. You have either of these problems for more than 6 hours: Vomiting every time you eat or drink. Diarrhea. Get help right away if you: Become confused or cannot think clearly. Have trouble breathing. Have moderate or high ketones in your urine. Feel your baby is not moving as usual. Develop unusual discharge or bleeding from your vagina. Start having early (premature) contractions. Contractions  may feel like a tightening in your lower abdomen Have a severe headache. These symptoms may represent a serious problem that is an emergency. Do not wait to see if the symptoms will go away. Get medical help right away. Call your local emergency services (911 in the [place] Do not drive yourself to the hospital. Summary Check your blood glucose every day during your pregnancy. Do this as often as told by your health care provider. Take insulin or other diabetes medicines every day, if your health care provider prescribed them. Have your blood glucose level checked 4-12 weeks after delivery. Keep all follow-up visits. This is important. This information is not intended to replace advice given to you by your health care provider. Make sure you discuss any questions you have with your health care provider. Document Revised: 08/26/2019 Document Reviewed: 08/26/2019 Elsevier Patient Education  [place].

## 2021-10-15 NOTE — Progress Notes (Signed)
ROB: Patient denies any complaints today.  Discussion had with patient on new diagnosis of gestational diabetes late in pregnancy.  Patient has questions if her blood sugar was affected by her eating as she had eaten within 2 hours of having her blood drawn.  I discussed that her glucose level (151) could possibly be affected however her HgbA1c was not affected by this and did indeed suggest that patient was diabetic.  I discussed options for management for the remainder of her pregnancy, including treating patient as uncontrolled gestational diabetic and suggest delivery in the 39th week versus allowing patient this week to collect her blood sugars and if within acceptable range, could continue her pregnancy to her due date or beyond.  Patient also with recent ultrasound last week for growth, normal growth noted at 63rd percentile.  After discussion of her options, patient notes she would like to check her blood sugars over the weekend and continue with dietary modification.  Notes understanding that if her blood sugars are elevated we would recommend delivery at 39 weeks.  Reviewed third trimester vaginal cultures, negative results.  NST performed today was reviewed and was found to be reactive.  Continue recommended antenatal testing and prenatal care. RTC in 1 week with NST.     NONSTRESS TEST INTERPRETATION  INDICATIONS: Gestational diabetes (diet controlled?)  FHR baseline: 140 bpm RESULTS:Reactive COMMENTS: Uterine irritability.    PLAN: 1. Continue fetal kick counts twice a day. 2. Continue antepartum testing as scheduled-weekly

## 2021-10-17 ENCOUNTER — Encounter: Payer: Self-pay | Admitting: Obstetrics and Gynecology

## 2021-10-21 ENCOUNTER — Other Ambulatory Visit: Payer: PRIVATE HEALTH INSURANCE

## 2021-10-21 ENCOUNTER — Observation Stay: Payer: 59

## 2021-10-21 ENCOUNTER — Encounter: Payer: Self-pay | Admitting: Obstetrics and Gynecology

## 2021-10-21 ENCOUNTER — Ambulatory Visit (INDEPENDENT_AMBULATORY_CARE_PROVIDER_SITE_OTHER): Payer: PRIVATE HEALTH INSURANCE | Admitting: Obstetrics and Gynecology

## 2021-10-21 ENCOUNTER — Other Ambulatory Visit: Payer: Self-pay

## 2021-10-21 ENCOUNTER — Observation Stay
Admission: RE | Admit: 2021-10-21 | Discharge: 2021-10-21 | Disposition: A | Discharge status: Home / Self Care | Payer: 59 | Source: Ambulatory Visit | Attending: Obstetrics and Gynecology | Admitting: Obstetrics and Gynecology

## 2021-10-21 ENCOUNTER — Telehealth: Payer: Self-pay | Admitting: Obstetrics and Gynecology

## 2021-10-21 ENCOUNTER — Encounter: Payer: PRIVATE HEALTH INSURANCE | Admitting: Obstetrics and Gynecology

## 2021-10-21 VITALS — BP 118/80 | HR 84 | Wt 159.2 lb

## 2021-10-21 DIAGNOSIS — O24410 Gestational diabetes mellitus in pregnancy, diet controlled: Secondary | ICD-10-CM | POA: Insufficient documentation

## 2021-10-21 DIAGNOSIS — Z3483 Encounter for supervision of other normal pregnancy, third trimester: Secondary | ICD-10-CM

## 2021-10-21 DIAGNOSIS — Z3A38 38 weeks gestation of pregnancy: Secondary | ICD-10-CM | POA: Diagnosis not present

## 2021-10-21 DIAGNOSIS — O368130 Decreased fetal movements, third trimester, not applicable or unspecified: Secondary | ICD-10-CM | POA: Insufficient documentation

## 2021-10-21 DIAGNOSIS — Z6838 Body mass index (BMI) 38.0-38.9, adult: Secondary | ICD-10-CM | POA: Insufficient documentation

## 2021-10-21 DIAGNOSIS — Z3490 Encounter for supervision of normal pregnancy, unspecified, unspecified trimester: Secondary | ICD-10-CM

## 2021-10-21 LAB — POCT URINALYSIS DIPSTICK OB
Bilirubin, UA: NEGATIVE
Blood, UA: NEGATIVE
Glucose, UA: NEGATIVE
Ketones, UA: NEGATIVE
Leukocytes, UA: NEGATIVE
Nitrite, UA: NEGATIVE
Spec Grav, UA: 1.02 (ref 1.010–1.025)
Urobilinogen, UA: 0.2 E.U./dL
pH, UA: 6 (ref 5.0–8.0)

## 2021-10-21 LAB — GLUCOSE, CAPILLARY: Glucose-Capillary: 59 mg/dL — ABNORMAL LOW (ref 70–99)

## 2021-10-21 MED ORDER — LACTATED RINGERS IV BOLUS
1000.0000 mL | Freq: Once | INTRAVENOUS | Status: AC
  Administered 2021-10-21: 1000 mL via INTRAVENOUS

## 2021-10-21 NOTE — Progress Notes (Addendum)
Discharge instructions provided to patient. Patient verbalized understanding. Pt educated on signs and symptoms of labor, vaginal bleeding, LOF, and fetal movement. Red flag signs reviewed by RN. Patient discharged home in stable condition. 

## 2021-10-21 NOTE — OB Triage Note (Signed)
Emily Wolf 36 y.o. presents to Labor & Delivery triage after being sent over from the office for fetal monitoring. She is a G2P0010 at [redacted]w[redacted]d. She denies signs and symptoms consistent with rupture of membranes or active vaginal bleeding. She denies contractions and states positive fetal movement. External FM and TOCO applied to non-tender abdomen. Initial FHR 150. Vital signs obtained and within normal limits. Patient oriented to care environment including call bell and bed control use. TGrandville Silos CNM notified of patient's arrival. Plan to place in observation.

## 2021-10-21 NOTE — Telephone Encounter (Signed)
Mr. called with concerns and questions about [name]'s  OB Care Plan and how to prepare. Mr. sounded frustrated and inebriated. Mr. states  [name] is overwhelmed with multiple appts through out the week, while he is out of town at work and unable to attend. Mr states "he  would like to know why there are multiple appts for his wife".   He is concerned about additional MFM, NST and Diabetes class appointments. I was unable to end the call until Mr.  Emily Wolf talking. I waited until I was able to end the call properly.

## 2021-10-21 NOTE — Progress Notes (Signed)
ROB: Reports daily fetal movement.  She checked her sugars for 2 days this last weekend and when she followed her diet her sugars were good, when she did not follow her diet they were elevated.  Under this scenario it is impossible to determine whether she is a diet-controlled diabetic or has now failed diabetic control.  After long discussion regarding her options she has elected to keep her sugars much more vigilantly over the next 5 days and to follow a strict diet rather than to assume she is a poorly controlled gestational diabetic and perform 39-week induction..  If she demonstrates diabetic control she may go past her due date as a diet-controlled gestational diabetic.  If she shows lack of dietary control of her sugars she will be induced prior to 40 weeks. NST today shows multiple fetal movements but no accelerations present.  No decelerations.  Biophysical profile currently unavailable-patient sent to labor and delivery for further monitoring/NST.

## 2021-10-21 NOTE — Progress Notes (Signed)
Emily Wolf. Patient states fetal movement with pelvic pressure. She states occasional braxton hicks. Patient states no questions or concerns at this time.

## 2021-10-21 NOTE — OB Triage Note (Signed)
    L&D OB Triage Note  SUBJECTIVE Emily Wolf is a 36 y.o. G2P0010 female at [redacted]w[redacted]d, EDD Estimated Date of Delivery: 10/29/21 who presented to triage from the office with prolonged monitoring due to non reactive NST in the office. She has history of GDM diet control .   OB History  Gravida Para Term Preterm AB Living  2 0 0 0 1 0  SAB IAB Ectopic Multiple Live Births  1 0 0 0 0    # Outcome Date GA Lbr Len/2nd Weight Sex Delivery Anes PTL Lv  2 Current           1 SAB 09/2020            Medications Prior to Admission  Medication Sig Dispense Refill Last Dose   albuterol (VENTOLIN HFA) 108 (90 Base) MCG/ACT inhaler Inhale 2 puffs into the lungs every 6 (six) hours as needed for wheezing or shortness of breath.   Past Week   blood glucose meter kit and supplies KIT Dispense based on patient and insurance preference. Use up to four times daily as directed. 1 each 0 10/20/2021   Fluticasone-Salmeterol (ADVAIR) 250-50 MCG/DOSE AEPB Inhale 1 puff into the lungs 2 (two) times daily.   10/20/2021   Prenatal Vit-Fe Fumarate-FA (PRENATAL PO) Take by mouth.   10/21/2021   BUDESONIDE NA Place into the nose. (Patient not taking: Reported on 10/21/2021)   Not Taking     OBJECTIVE  Nursing Evaluation:   BP 115/76 (BP Location: Right Arm)   Pulse 89   Temp 98 F (36.7 C) (Axillary)   Resp 14   LMP 01/22/2021 (Exact Date)    Findings:   reactive NST BPP8/8        NST was performed and has been reviewed by me.  NST INTERPRETATION: Category I  Mode: External Baseline Rate (A): 135 bpm Variability: Moderate Accelerations: None Deceleration: x1 , BPP completed 8/8     Contraction Frequency (min): occasional with UI (Simultaneous filing. User may not have seen previous data.)  ASSESSMENT Impression:  1.  Pregnancy:  G2P0010 at [redacted]w[redacted]d , EDD Estimated Date of Delivery: 10/29/21 2.  Reassuring fetal and maternal status 3.  BPP reassuring   PLAN 1. Current condition and above findings  reviewed.  Reassuring fetal and maternal condition.Dr. Marcelline Mates consulted and in agreement. 2. Discharge home with standard labor precautions given to return to L&D or call the office for problems. 3. Continue routine prenatal care.Follow up in office next week for NST and ROB .   Philip Aspen, CNM

## 2021-10-23 ENCOUNTER — Inpatient Hospital Stay: Payer: 59 | Admitting: Anesthesiology

## 2021-10-23 ENCOUNTER — Encounter: Payer: Self-pay | Admitting: Obstetrics

## 2021-10-23 ENCOUNTER — Inpatient Hospital Stay
Admission: EM | Admit: 2021-10-23 | Discharge: 2021-10-27 | DRG: 788 | Disposition: A | Discharge status: Home / Self Care | Payer: 59 | Attending: Certified Nurse Midwife | Admitting: Certified Nurse Midwife

## 2021-10-23 ENCOUNTER — Other Ambulatory Visit: Payer: Self-pay

## 2021-10-23 DIAGNOSIS — D62 Acute posthemorrhagic anemia: Secondary | ICD-10-CM | POA: Diagnosis not present

## 2021-10-23 DIAGNOSIS — O26893 Other specified pregnancy related conditions, third trimester: Secondary | ICD-10-CM | POA: Diagnosis present

## 2021-10-23 DIAGNOSIS — Z3A39 39 weeks gestation of pregnancy: Secondary | ICD-10-CM

## 2021-10-23 DIAGNOSIS — Z98891 History of uterine scar from previous surgery: Principal | ICD-10-CM

## 2021-10-23 DIAGNOSIS — O339 Maternal care for disproportion, unspecified: Secondary | ICD-10-CM | POA: Diagnosis present

## 2021-10-23 DIAGNOSIS — O24420 Gestational diabetes mellitus in childbirth, diet controlled: Secondary | ICD-10-CM | POA: Diagnosis present

## 2021-10-23 DIAGNOSIS — O9903 Anemia complicating the puerperium: Secondary | ICD-10-CM | POA: Diagnosis not present

## 2021-10-23 DIAGNOSIS — O9902 Anemia complicating childbirth: Secondary | ICD-10-CM | POA: Diagnosis present

## 2021-10-23 DIAGNOSIS — O24410 Gestational diabetes mellitus in pregnancy, diet controlled: Secondary | ICD-10-CM | POA: Diagnosis not present

## 2021-10-23 DIAGNOSIS — O329XX0 Maternal care for malpresentation of fetus, unspecified, not applicable or unspecified: Secondary | ICD-10-CM | POA: Diagnosis present

## 2021-10-23 LAB — TYPE AND SCREEN
ABO/RH(D): A POS
Antibody Screen: NEGATIVE

## 2021-10-23 LAB — CBC
HCT: 42.2 % (ref 36.0–46.0)
Hemoglobin: 13.1 g/dL (ref 12.0–15.0)
MCH: 26.8 pg (ref 26.0–34.0)
MCHC: 31 g/dL (ref 30.0–36.0)
MCV: 86.3 fL (ref 80.0–100.0)
Platelets: 230 10*3/uL (ref 150–400)
RBC: 4.89 MIL/uL (ref 3.87–5.11)
RDW: 16.3 % — ABNORMAL HIGH (ref 11.5–15.5)
WBC: 7.6 10*3/uL (ref 4.0–10.5)
nRBC: 0 % (ref 0.0–0.2)

## 2021-10-23 LAB — GLUCOSE, CAPILLARY
Glucose-Capillary: 96 mg/dL (ref 70–99)
Glucose-Capillary: 99 mg/dL (ref 70–99)
Glucose-Capillary: 115 mg/dL — ABNORMAL HIGH (ref 70–99)

## 2021-10-23 MED ORDER — EPHEDRINE 5 MG/ML INJ: 10.0000 mg | INTRAVENOUS | Status: DC | PRN

## 2021-10-23 MED ORDER — FENTANYL-BUPIVACAINE-NACL 0.5-0.125-0.9 MG/250ML-% EP SOLN
10.0000 mL/h | EPIDURAL | Status: DC | PRN
  Administered 2021-10-23: 10 mL/h via EPIDURAL

## 2021-10-23 MED ORDER — OXYTOCIN 10 UNIT/ML IJ SOLN
INTRAMUSCULAR | Status: AC
  Filled 2021-10-23: qty 2

## 2021-10-23 MED ORDER — ONDANSETRON HCL 4 MG/2ML IJ SOLN
4.0000 mg | Freq: Four times a day (QID) | INTRAMUSCULAR | Status: DC | PRN
Start: 2021-10-23 — End: 2021-10-24
  Administered 2021-10-23: 4 mg via INTRAVENOUS
  Filled 2021-10-23: qty 2

## 2021-10-23 MED ORDER — LIDOCAINE HCL (PF) 1 % IJ SOLN: 30.0000 mL | INTRAMUSCULAR | Status: DC | PRN

## 2021-10-23 MED ORDER — LIDOCAINE HCL (PF) 1 % IJ SOLN
INTRAMUSCULAR | Status: DC | PRN
  Administered 2021-10-23: 2 mL via SUBCUTANEOUS
  Administered 2021-10-23: 3 mL via SUBCUTANEOUS

## 2021-10-23 MED ORDER — OXYTOCIN-SODIUM CHLORIDE 30-0.9 UT/500ML-% IV SOLN
2.5000 [IU]/h | INTRAVENOUS | Status: DC
  Administered 2021-10-24: 2.5 [IU]/h via INTRAVENOUS
  Administered 2021-10-24: 100 [IU]/h via INTRAVENOUS
  Filled 2021-10-23 (×2): qty 500

## 2021-10-23 MED ORDER — LACTATED RINGERS IV SOLN: 500.0000 mL | INTRAVENOUS | Status: DC | PRN

## 2021-10-23 MED ORDER — OXYTOCIN BOLUS FROM INFUSION: 333.0000 mL | Freq: Once | INTRAVENOUS | Status: DC

## 2021-10-23 MED ORDER — SODIUM CHLORIDE 0.9 % IV SOLN
INTRAVENOUS | Status: DC | PRN
  Administered 2021-10-23: 6 mL via EPIDURAL

## 2021-10-23 MED ORDER — MISOPROSTOL 200 MCG PO TABS
ORAL_TABLET | ORAL | Status: AC
  Filled 2021-10-23: qty 4

## 2021-10-23 MED ORDER — OXYTOCIN-SODIUM CHLORIDE 30-0.9 UT/500ML-% IV SOLN
INTRAVENOUS | Status: AC
  Administered 2021-10-23: 2 m[IU]/min via INTRAVENOUS
  Filled 2021-10-23: qty 500

## 2021-10-23 MED ORDER — NON FORMULARY: 1.0000 | Freq: Two times a day (BID) | Status: DC

## 2021-10-23 MED ORDER — LACTATED RINGERS IV SOLN
500.0000 mL | Freq: Once | INTRAVENOUS | Status: AC
  Administered 2021-10-23: 500 mL via INTRAVENOUS

## 2021-10-23 MED ORDER — DIPHENHYDRAMINE HCL 50 MG/ML IJ SOLN
12.5000 mg | INTRAMUSCULAR | Status: DC | PRN
  Filled 2021-10-23: qty 1

## 2021-10-23 MED ORDER — LIDOCAINE HCL (PF) 1 % IJ SOLN
INTRAMUSCULAR | Status: AC
  Filled 2021-10-23: qty 30

## 2021-10-23 MED ORDER — TERBUTALINE SULFATE 1 MG/ML IJ SOLN: 0.2500 mg | Freq: Once | INTRAMUSCULAR | Status: DC | PRN

## 2021-10-23 MED ORDER — PHENYLEPHRINE 80 MCG/ML (10ML) SYRINGE FOR IV PUSH (FOR BLOOD PRESSURE SUPPORT): 80.0000 ug | PREFILLED_SYRINGE | INTRAVENOUS | Status: DC | PRN

## 2021-10-23 MED ORDER — LIDOCAINE-EPINEPHRINE (PF) 1.5 %-1:200000 IJ SOLN
INTRAMUSCULAR | Status: DC | PRN
  Administered 2021-10-23: 3 mL via EPIDURAL

## 2021-10-23 MED ORDER — FENTANYL-BUPIVACAINE-NACL 0.5-0.125-0.9 MG/250ML-% EP SOLN
EPIDURAL | Status: AC
  Filled 2021-10-23: qty 250

## 2021-10-23 MED ORDER — LACTATED RINGERS IV SOLN: INTRAVENOUS | Status: DC

## 2021-10-23 MED ORDER — AMMONIA AROMATIC IN INHA
RESPIRATORY_TRACT | Status: AC
  Filled 2021-10-23: qty 10

## 2021-10-23 MED ORDER — ACETAMINOPHEN 325 MG PO TABS
650.0000 mg | ORAL_TABLET | ORAL | Status: DC | PRN
  Administered 2021-10-24: 650 mg via ORAL
  Filled 2021-10-23: qty 2

## 2021-10-23 MED ORDER — OXYTOCIN-SODIUM CHLORIDE 30-0.9 UT/500ML-% IV SOLN: 1.0000 m[IU]/min | INTRAVENOUS | Status: DC

## 2021-10-23 MED ORDER — ALBUTEROL SULFATE HFA 108 (90 BASE) MCG/ACT IN AERS
2.0000 | INHALATION_SPRAY | Freq: Four times a day (QID) | RESPIRATORY_TRACT | Status: DC | PRN
  Administered 2021-10-23: 2 via RESPIRATORY_TRACT
  Filled 2021-10-23: qty 6.7

## 2021-10-23 NOTE — H&P (Signed)
OB History & Physical   History of Present Illness:  Chief Complaint: contractions  HPI:  Emily Wolf is a 36 y.o. G2P0010 female at 4w1ddated by LMP.  Her pregnancy has been complicated by diet controlled gestational diabetes .    She reports contractions.   She denies leakage of fluid.   She denies vaginal bleeding.   She reports fetal movement.    Total weight gain for pregnancy: 15.4 kg   Obstetrical Problem List: G2 Problems (from 03/30/21 to present)     No problems associated with this episode.        Maternal Medical History:   Past Medical History:  Diagnosis Date   Aspirin-exacerbated respiratory disease (AERD)    Asthma    Kidney stone    Miscarriage     Past Surgical History:  Procedure Laterality Date   EXTRACORPOREAL SHOCK WAVE LITHOTRIPSY Right 05/28/2020   Procedure: EXTRACORPOREAL SHOCK WAVE LITHOTRIPSY (ESWL);  Surgeon: SAbbie Sons MD;  Location: [facility] ORS;  Service: Urology;  Laterality: Right;   NASAL ENDOSCOPY N/A    NASAL SINUS SURGERY      Allergies  Allergen Reactions   Aspirin Shortness Of Breath   Ibuprofen Shortness Of Breath   Nsaids Shortness Of Breath   Augmentin [Amoxicillin-Pot Clavulanate] Hives    Prior to Admission medications   Medication Sig Start Date End Date Taking? Authorizing Provider  albuterol (VENTOLIN HFA) 108 (90 Base) MCG/ACT inhaler Inhale 2 puffs into the lungs every 6 (six) hours as needed for wheezing or shortness of breath.   Yes [provider]  blood glucose meter kit and supplies KIT Dispense based on patient and insurance preference. Use up to four times daily as directed. 10/08/21  Yes Swanson, Melissa M, CNM  Fluticasone-Salmeterol (ADVAIR) 250-50 MCG/DOSE AEPB Inhale 1 puff into the lungs 2 (two) times daily.   Yes [provider]  Prenatal Vit-Fe Fumarate-FA (PRENATAL PO) Take by mouth.   Yes [provider]  BUDESONIDE NA Place into the nose. Patient not taking:  Reported on 10/21/2021    [provider]  oxymetazoline (AFRIN) 0.05 % nasal spray Place 1 spray into both nostrils 2 (two) times daily as needed for congestion.    [provider]    OB History  Gravida Para Term Preterm AB Living  2       1    SAB IAB Ectopic Multiple Live Births  1            # Outcome Date GA Lbr Len/2nd Weight Sex Delivery Anes PTL Lv  2 Current           1 SAB 09/2020            Prenatal care site: [facility]  Social History: She  reports that she has never smoked. She has never been exposed to tobacco smoke. She has never used smokeless tobacco. She reports that she does not currently use alcohol. She reports that she does not use drugs.  Family History: family history includes Asthma in her mother and sister; Diabetes in her maternal grandmother; Healthy in her father; Sickle cell trait in her sister.    Review of Systems:  Review of Systems  Constitutional:  Negative for chills and fever.  HENT:  Negative for congestion, ear discharge, ear pain, hearing loss, sinus pain and sore throat.   Eyes:  Negative for blurred vision and double vision.  Respiratory:  Negative for cough, shortness of breath and  wheezing.   Cardiovascular:  Negative for chest pain, palpitations and leg swelling.  Gastrointestinal:  Positive for abdominal pain. Negative for blood in stool, constipation, diarrhea, heartburn, melena, nausea and vomiting.  Genitourinary:  Negative for dysuria, flank pain, frequency, hematuria and urgency.  Musculoskeletal:  Negative for back pain, joint pain and myalgias.  Skin:  Negative for itching and rash.  Neurological:  Negative for dizziness, tingling, tremors, sensory change, speech change, focal weakness, seizures, loss of consciousness, weakness and headaches.  Endo/Heme/Allergies:  Negative for environmental allergies. Does not bruise/bleed easily.  Psychiatric/Behavioral:  Negative for depression, hallucinations,  memory loss, substance abuse and suicidal ideas. The patient is not nervous/anxious and does not have insomnia.      Physical Exam:  Ht 4' 8.75" (1.441 m)   Wt 72.1 kg   LMP 01/22/2021 (Exact Date)   BMI 34.71 kg/m   Constitutional: Well nourished, well developed female in no acute distress.  HEENT: normal Skin: Warm and dry.  Cardiovascular: Regular rate and rhythm.   Extremity:  no edema   Respiratory: Clear to auscultation bilateral. Normal respiratory effort Abdomen: FHT present Back: no CVAT Neuro: DTRs 2+, Cranial nerves grossly intact Psych: Alert and Oriented x3. No memory deficits. Normal mood and affect.  MS: normal gait, normal bilateral lower extremity ROM/strength/stability.  Pelvic exam: (female chaperone present) is not limited by body habitus EGBUS: within normal limits Vagina: within normal limits and with normal mucosa Cervix: 4/70/-2   Baseline FHR: 140 beats/min   Variability: moderate   Accelerations: present   Decelerations: absent Contractions: present frequency: every 3-4 Overall assessment: reassuring   Lab Results  Component Value Date   [place] NEGATIVE 05/26/2020    Assessment:  Emily Wolf is a 36 y.o. G65P0010 female at 71w1dwith active labor.   Plan:  Admit to Labor & Delivery  CBC, T&S, Clrs, IVF GBS negative.   Fetal well-being: Category I Expectant management for SVD, augment as needed with pitocin/AROM    JRod Can CNM 10/23/2021 10:45 AM

## 2021-10-23 NOTE — Progress Notes (Signed)
  Labor Progress Note   36 y.o. G2P0010 @ [redacted]w[redacted]d, admitted for  Pregnancy, Labor Management.   Subjective:  Comfortable with epidural.   Objective:  BP 117/67   Pulse 83   Temp 98.7 F (37.1 C) (Oral)   Resp 18   Ht 4' 8.75" (1.441 m)   Wt 72.1 kg   LMP 01/22/2021 (Exact Date)   BMI 34.71 kg/m  Abd: gravid, ND, FHT present, mild tenderness on exam Extr: no edema SVE: CERVIX: 6 cm dilated, 70 effaced, -2 station Vertex by u/s and cervical exam Cervical sweep  EFM: FHR: 135 bpm, variability: minimal to moderate,  accelerations:  Present,  decelerations:  early/variable Toco: Frequency: Every 2-6 minutes Labs: I have reviewed the patient's lab results.   Assessment & Plan:  G2P0010 @ 363w1dadmitted for  Pregnancy and Labor/Delivery Management  1. Pain management: epidural. 2. FWB: FHT category II.  3. ID: GBS negative 4. Labor management: augment with pitocin prn, AROM when appropriate  All discussed with patient, see orders   JaRod CanCNNew Meadowsroup 10/23/2021  5:49 PM

## 2021-10-23 NOTE — Anesthesia Procedure Notes (Signed)
Epidural Patient location during procedure: OB  Staffing Anesthesiologist: Arita Miss, MD Performed: anesthesiologist   Preanesthetic Checklist Completed: patient identified, IV checked, site marked, risks and benefits discussed, surgical consent, monitors and equipment checked, pre-op evaluation and timeout performed  Epidural Patient position: sitting Prep: ChloraPrep Patient monitoring: heart rate, continuous pulse ox and blood pressure Approach: midline Location: L4-L5 Injection technique: LOR saline  Needle:  Needle type: Tuohy  Needle gauge: 17 G Needle length: 9 cm Needle insertion depth: 5 cm Catheter type: closed end flexible Catheter size: 19 Gauge Catheter at skin depth: 10 cm Test dose: negative and 1.5% lidocaine with Epi 1:200 K  Assessment Sensory level: T10 Events: blood aspirated, injection not painful, no injection resistance, no paresthesia and negative IV test  Additional Notes Two attempts total. First attempt at L3-L4 with +heme aspiration; epidural removed prior to any medication. Second attempts at L4-L5 successful, negative aspiration, negative test dose. Pt. Evaluated and documentation done after procedure finished. Patient identified. Risks/Benefits/Options discussed with patient including but not limited to bleeding, infection, nerve damage, paralysis, failed block, incomplete pain control, headache, blood pressure changes, nausea, vomiting, reactions to medication both or allergic, itching and postpartum back pain. Confirmed with bedside nurse the patient's most recent platelet count. Confirmed with patient that they are not currently taking any anticoagulation, have any bleeding history or any family history of bleeding disorders. Patient expressed understanding and wished to proceed. All questions were answered. Sterile technique was used throughout the entire procedure. Please see nursing notes for vital signs. Test dose was given through epidural  catheter and negative prior to continuing to dose epidural or start infusion. Warning signs of high block given to the patient including shortness of breath, tingling/numbness in hands, complete motor block, or any concerning symptoms with instructions to call for help. Patient was given instructions on fall risk and not to get out of bed. All questions and concerns addressed with instructions to call with any issues or inadequate analgesia.     Patient tolerated the insertion well without immediate complications.  Reason for block: procedure for painReason for block:procedure for pain

## 2021-10-23 NOTE — Progress Notes (Signed)
  Labor Progress Note   36 y.o. G2P0010 @ [redacted]w[redacted]d, admitted for  Pregnancy, Labor Management.   Subjective:  Comfortable with epidural. Feeling some pressure with contractions  Objective:  BP 119/78   Pulse 80   Temp 98.2 F (36.8 C) (Oral)   Resp 18   Ht 4' 8.75" (1.441 m)   Wt 72.1 kg   LMP 01/22/2021 (Exact Date)   SpO2 99%   BMI 34.71 kg/m  Abd: gravid, ND, FHT present, mild tenderness on exam Extr: no edema SVE: CERVIX: 8 cm dilated, 80 effaced, -2 station SROM at 9:20 PM large amount, meconium stained   EFM: FHR: 135 bpm, variability: moderate,  accelerations:  Absent,  decelerations:  early Toco: Frequency: Every 1-3 minutes Labs: I have reviewed the patient's lab results.   Assessment & Plan:  G2P0010 @ 343w1dadmitted for  Pregnancy and Labor/Delivery Management  1. Pain management: epidural. 2. FWB: FHT category II.  3. ID: GBS negative 4. Labor management: continue pitocin titration  All discussed with patient, see orders   JaRod CanCNLaguna Beachroup 10/23/2021  9:48 PM

## 2021-10-23 NOTE — Anesthesia Preprocedure Evaluation (Signed)
Anesthesia Evaluation  Patient identified by MRN, date of birth, ID band Patient awake    Reviewed: Allergy & Precautions, NPO status , Patient's Chart, lab work & pertinent test results  History of Anesthesia Complications Negative for: history of anesthetic complications  Airway Mallampati: II  TM Distance: >3 FB Neck ROM: Full    Dental no notable dental hx. (+) Teeth Intact   Pulmonary asthma , neg sleep apnea, neg COPD, Patient abstained from smoking.Not current smoker,  Never hospitalized for asthma   Pulmonary exam normal breath sounds clear to auscultation       Cardiovascular Exercise Tolerance: Good METS(-) hypertension(-) CAD and (-) Past MI negative cardio ROS  (-) dysrhythmias  Rhythm:Regular Rate:Normal - Systolic murmurs    Neuro/Psych negative neurological ROS  negative psych ROS   GI/Hepatic neg GERD  ,(+)     (-) substance abuse  ,   Endo/Other  neg diabetes  Renal/GU negative Renal ROS     Musculoskeletal   Abdominal   Peds  Hematology   Anesthesia Other Findings Past Medical History: No date: Aspirin-exacerbated respiratory disease (AERD) No date: Asthma No date: Kidney stone No date: Miscarriage  Reproductive/Obstetrics (+) Pregnancy                             Anesthesia Physical Anesthesia Plan  ASA: 2  Anesthesia Plan: Epidural   Post-op Pain Management:    Induction:   PONV Risk Score and Plan: 2 and Treatment may vary due to age or medical condition and Ondansetron  Airway Management Planned: Natural Airway  Additional Equipment:   Intra-op Plan:   Post-operative Plan:   Informed Consent: I have reviewed the patients History and Physical, chart, labs and discussed the procedure including the risks, benefits and alternatives for the proposed anesthesia with the patient or authorized representative who has indicated his/her understanding and  acceptance.       Plan Discussed with: Surgeon  Anesthesia Plan Comments: (Discussed R/B/A of neuraxial anesthesia technique with patient: - rare risks of spinal/epidural hematoma, nerve damage, infection - Risk of PDPH - Risk of itching - Risk of nausea and vomiting - Risk of poor block necessitating replacement of epidural. - Risk of allergic reactions. Patient voiced understanding.)        Anesthesia Quick Evaluation

## 2021-10-24 ENCOUNTER — Encounter
Admission: EM | Disposition: A | Discharge status: Home / Self Care | Payer: Self-pay | Source: Home / Self Care | Attending: Certified Nurse Midwife

## 2021-10-24 ENCOUNTER — Encounter: Payer: Self-pay | Admitting: Obstetrics and Gynecology

## 2021-10-24 ENCOUNTER — Other Ambulatory Visit: Payer: Self-pay

## 2021-10-24 DIAGNOSIS — D62 Acute posthemorrhagic anemia: Secondary | ICD-10-CM

## 2021-10-24 DIAGNOSIS — O9903 Anemia complicating the puerperium: Secondary | ICD-10-CM

## 2021-10-24 DIAGNOSIS — Z3A39 39 weeks gestation of pregnancy: Secondary | ICD-10-CM

## 2021-10-24 DIAGNOSIS — O24410 Gestational diabetes mellitus in pregnancy, diet controlled: Secondary | ICD-10-CM

## 2021-10-24 LAB — GLUCOSE, CAPILLARY
Glucose-Capillary: 114 mg/dL — ABNORMAL HIGH (ref 70–99)
Glucose-Capillary: 117 mg/dL — ABNORMAL HIGH (ref 70–99)

## 2021-10-24 LAB — RPR: RPR Ser Ql: NONREACTIVE

## 2021-10-24 SURGERY — Surgical Case
Anesthesia: Epidural

## 2021-10-24 MED ORDER — PHENYLEPHRINE HCL (PRESSORS) 10 MG/ML IV SOLN
INTRAVENOUS | Status: DC | PRN
  Administered 2021-10-24: 160 ug via INTRAVENOUS
  Administered 2021-10-24 (×2): 120 ug via INTRAVENOUS
  Administered 2021-10-24 (×3): 160 ug via INTRAVENOUS

## 2021-10-24 MED ORDER — ONDANSETRON HCL 4 MG/2ML IJ SOLN: 4.0000 mg | Freq: Three times a day (TID) | INTRAMUSCULAR | Status: DC | PRN

## 2021-10-24 MED ORDER — FERROUS SULFATE 325 (65 FE) MG PO TABS
325.0000 mg | ORAL_TABLET | Freq: Two times a day (BID) | ORAL | Status: DC
Start: 2021-10-24 — End: 2021-10-27
  Administered 2021-10-24 – 2021-10-27 (×7): 325 mg via ORAL
  Filled 2021-10-24 (×7): qty 1

## 2021-10-24 MED ORDER — HYDROMORPHONE HCL 1 MG/ML IJ SOLN: 0.2000 mg | INTRAMUSCULAR | Status: DC | PRN

## 2021-10-24 MED ORDER — OXYCODONE-ACETAMINOPHEN 5-325 MG PO TABS
2.0000 | ORAL_TABLET | ORAL | Status: DC | PRN
  Administered 2021-10-25: 2 via ORAL
  Filled 2021-10-24: qty 2

## 2021-10-24 MED ORDER — VANCOMYCIN HCL IN DEXTROSE 1-5 GM/200ML-% IV SOLN: 1000.0000 mg | Freq: Once | INTRAVENOUS | Status: DC

## 2021-10-24 MED ORDER — CEFAZOLIN SODIUM-DEXTROSE 2-4 GM/100ML-% IV SOLN
2.0000 g | INTRAVENOUS | Status: AC
  Administered 2021-10-24: 2 g via INTRAVENOUS

## 2021-10-24 MED ORDER — CHLORHEXIDINE GLUCONATE 0.12 % MT SOLN
OROMUCOSAL | Status: AC
  Administered 2021-10-24: 15 mL
  Filled 2021-10-24: qty 15

## 2021-10-24 MED ORDER — DIPHENHYDRAMINE HCL 50 MG/ML IJ SOLN
50.0000 mg | Freq: Once | INTRAMUSCULAR | Status: AC
  Administered 2021-10-24: 50 mg via INTRAVENOUS

## 2021-10-24 MED ORDER — SODIUM CHLORIDE 0.9 % IV SOLN
INTRAVENOUS | Status: AC
  Filled 2021-10-24: qty 5

## 2021-10-24 MED ORDER — ONDANSETRON HCL 4 MG/2ML IJ SOLN
INTRAMUSCULAR | Status: DC | PRN
  Administered 2021-10-24: 4 mg via INTRAVENOUS

## 2021-10-24 MED ORDER — DIBUCAINE (PERIANAL) 1 % EX OINT: 1.0000 | TOPICAL_OINTMENT | CUTANEOUS | Status: DC | PRN

## 2021-10-24 MED ORDER — SOD CITRATE-CITRIC ACID 500-334 MG/5ML PO SOLN: 30.0000 mL | ORAL | Status: AC

## 2021-10-24 MED ORDER — MORPHINE SULFATE (PF) 0.5 MG/ML IJ SOLN
INTRAMUSCULAR | Status: DC | PRN
  Administered 2021-10-24 (×2): 1.5 mg via EPIDURAL

## 2021-10-24 MED ORDER — PHENYLEPHRINE 80 MCG/ML (10ML) SYRINGE FOR IV PUSH (FOR BLOOD PRESSURE SUPPORT)
PREFILLED_SYRINGE | INTRAVENOUS | Status: AC
  Filled 2021-10-24: qty 10

## 2021-10-24 MED ORDER — LACTATED RINGERS IV SOLN: INTRAVENOUS | Status: DC

## 2021-10-24 MED ORDER — MEASLES, MUMPS & RUBELLA VAC IJ SOLR
0.5000 mL | Freq: Once | INTRAMUSCULAR | Status: DC
  Filled 2021-10-24: qty 0.5

## 2021-10-24 MED ORDER — MORPHINE SULFATE (PF) 0.5 MG/ML IJ SOLN
INTRAMUSCULAR | Status: AC
  Filled 2021-10-24: qty 10

## 2021-10-24 MED ORDER — FLEET ENEMA 7-19 GM/118ML RE ENEM: 1.0000 | ENEMA | Freq: Every day | RECTAL | Status: DC | PRN

## 2021-10-24 MED ORDER — BUPIVACAINE HCL (PF) 0.5 % IJ SOLN
INTRAMUSCULAR | Status: DC | PRN
  Administered 2021-10-24: 60 mL

## 2021-10-24 MED ORDER — PRENATAL MULTIVITAMIN CH
1.0000 | ORAL_TABLET | Freq: Every day | ORAL | Status: DC
  Administered 2021-10-24 – 2021-10-27 (×4): 1 via ORAL
  Filled 2021-10-24 (×4): qty 1

## 2021-10-24 MED ORDER — OXYTOCIN-SODIUM CHLORIDE 30-0.9 UT/500ML-% IV SOLN: 2.5000 [IU]/h | INTRAVENOUS | Status: AC

## 2021-10-24 MED ORDER — SENNOSIDES-DOCUSATE SODIUM 8.6-50 MG PO TABS
2.0000 | ORAL_TABLET | ORAL | Status: DC
  Administered 2021-10-24 – 2021-10-27 (×4): 2 via ORAL
  Filled 2021-10-24 (×4): qty 2

## 2021-10-24 MED ORDER — BISACODYL 10 MG RE SUPP: 10.0000 mg | Freq: Every day | RECTAL | Status: DC | PRN

## 2021-10-24 MED ORDER — DEXAMETHASONE SODIUM PHOSPHATE 10 MG/ML IJ SOLN
INTRAMUSCULAR | Status: AC
  Filled 2021-10-24: qty 1

## 2021-10-24 MED ORDER — METHYLERGONOVINE MALEATE 0.2 MG/ML IJ SOLN
INTRAMUSCULAR | Status: AC
  Filled 2021-10-24: qty 1

## 2021-10-24 MED ORDER — FENTANYL CITRATE (PF) 100 MCG/2ML IJ SOLN
INTRAMUSCULAR | Status: AC
  Filled 2021-10-24: qty 2

## 2021-10-24 MED ORDER — SIMETHICONE 80 MG PO CHEW
80.0000 mg | CHEWABLE_TABLET | ORAL | Status: DC | PRN
  Administered 2021-10-24 – 2021-10-25 (×2): 80 mg via ORAL
  Filled 2021-10-24: qty 1

## 2021-10-24 MED ORDER — DIPHENHYDRAMINE HCL 50 MG/ML IJ SOLN: 12.5000 mg | INTRAMUSCULAR | Status: DC | PRN

## 2021-10-24 MED ORDER — NALOXONE HCL 0.4 MG/ML IJ SOLN
0.4000 mg | INTRAMUSCULAR | Status: DC | PRN
Start: 2021-10-24 — End: 2021-10-26

## 2021-10-24 MED ORDER — SCOPOLAMINE 1 MG/3DAYS TD PT72
1.0000 | MEDICATED_PATCH | Freq: Once | TRANSDERMAL | Status: AC
  Administered 2021-10-24: 1.5 mg via TRANSDERMAL
  Filled 2021-10-24: qty 1

## 2021-10-24 MED ORDER — BUPIVACAINE HCL (PF) 0.5 % IJ SOLN
INTRAMUSCULAR | Status: AC
  Filled 2021-10-24: qty 60

## 2021-10-24 MED ORDER — ACETAMINOPHEN 500 MG PO TABS
1000.0000 mg | ORAL_TABLET | Freq: Four times a day (QID) | ORAL | Status: DC
Start: 2021-10-24 — End: 2021-10-27
  Administered 2021-10-24 – 2021-10-27 (×12): 1000 mg via ORAL
  Filled 2021-10-24 (×12): qty 2

## 2021-10-24 MED ORDER — GENTAMICIN SULFATE 40 MG/ML IJ SOLN: 5.0000 mg/kg | INTRAVENOUS | Status: DC

## 2021-10-24 MED ORDER — DEXAMETHASONE SODIUM PHOSPHATE 10 MG/ML IJ SOLN
INTRAMUSCULAR | Status: DC | PRN
  Administered 2021-10-24: 10 mg via INTRAVENOUS

## 2021-10-24 MED ORDER — BUPIVACAINE HCL (PF) 0.25 % IJ SOLN: INTRAMUSCULAR | Status: DC | PRN

## 2021-10-24 MED ORDER — GABAPENTIN 300 MG PO CAPS
300.0000 mg | ORAL_CAPSULE | Freq: Every day | ORAL | Status: DC
  Administered 2021-10-24 – 2021-10-26 (×3): 300 mg via ORAL
  Filled 2021-10-24 (×3): qty 1

## 2021-10-24 MED ORDER — SODIUM CHLORIDE 0.9% FLUSH
3.0000 mL | INTRAVENOUS | Status: DC | PRN
  Administered 2021-10-24: 3 mL via INTRAVENOUS

## 2021-10-24 MED ORDER — OXYCODONE HCL 5 MG PO TABS
5.0000 mg | ORAL_TABLET | ORAL | Status: AC | PRN
  Administered 2021-10-24: 5 mg via ORAL
  Filled 2021-10-24: qty 1

## 2021-10-24 MED ORDER — NON FORMULARY: 1.0000 | Freq: Two times a day (BID) | Status: DC

## 2021-10-24 MED ORDER — SODIUM CHLORIDE 0.9 % IV SOLN
500.0000 mg | INTRAVENOUS | Status: AC
  Administered 2021-10-24: 500 mg via INTRAVENOUS

## 2021-10-24 MED ORDER — WITCH HAZEL-GLYCERIN EX PADS: 1.0000 | MEDICATED_PAD | CUTANEOUS | Status: DC | PRN

## 2021-10-24 MED ORDER — CEFAZOLIN SODIUM-DEXTROSE 2-4 GM/100ML-% IV SOLN
INTRAVENOUS | Status: AC
  Filled 2021-10-24: qty 100

## 2021-10-24 MED ORDER — TETANUS-DIPHTH-ACELL PERTUSSIS 5-2.5-18.5 LF-MCG/0.5 IM SUSY: 0.5000 mL | PREFILLED_SYRINGE | Freq: Once | INTRAMUSCULAR | Status: DC

## 2021-10-24 MED ORDER — DIPHENHYDRAMINE HCL 25 MG PO CAPS: 25.0000 mg | ORAL_CAPSULE | ORAL | Status: DC | PRN

## 2021-10-24 MED ORDER — MOMETASONE FURO-FORMOTEROL FUM 100-5 MCG/ACT IN AERO
2.0000 | INHALATION_SPRAY | Freq: Two times a day (BID) | RESPIRATORY_TRACT | Status: DC
Start: 2021-10-24 — End: 2021-10-27
  Administered 2021-10-24 – 2021-10-27 (×6): 2 via RESPIRATORY_TRACT
  Filled 2021-10-24: qty 8.8

## 2021-10-24 MED ORDER — ONDANSETRON HCL 4 MG/2ML IJ SOLN
INTRAMUSCULAR | Status: AC
  Filled 2021-10-24: qty 2

## 2021-10-24 MED ORDER — 0.9 % SODIUM CHLORIDE (POUR BTL) OPTIME
TOPICAL | Status: DC | PRN
  Administered 2021-10-24: 1000 mL

## 2021-10-24 MED ORDER — SOD CITRATE-CITRIC ACID 500-334 MG/5ML PO SOLN
ORAL | Status: AC
  Administered 2021-10-24: 30 mL via ORAL
  Filled 2021-10-24: qty 15

## 2021-10-24 MED ORDER — MENTHOL 3 MG MT LOZG: 1.0000 | LOZENGE | OROMUCOSAL | Status: DC | PRN

## 2021-10-24 MED ORDER — SODIUM CHLORIDE (PF) 0.9 % IJ SOLN
INTRAMUSCULAR | Status: AC
  Filled 2021-10-24: qty 50

## 2021-10-24 MED ORDER — ALBUTEROL SULFATE (2.5 MG/3ML) 0.083% IN NEBU: 3.0000 mL | INHALATION_SOLUTION | Freq: Four times a day (QID) | RESPIRATORY_TRACT | Status: DC | PRN

## 2021-10-24 MED ORDER — SIMETHICONE 80 MG PO CHEW
80.0000 mg | CHEWABLE_TABLET | Freq: Three times a day (TID) | ORAL | Status: DC
  Administered 2021-10-24 – 2021-10-27 (×8): 80 mg via ORAL
  Filled 2021-10-24 (×9): qty 1

## 2021-10-24 MED ORDER — ACETAMINOPHEN 500 MG PO TABS
1000.0000 mg | ORAL_TABLET | Freq: Four times a day (QID) | ORAL | Status: DC
  Filled 2021-10-24: qty 2

## 2021-10-24 MED ORDER — COCONUT OIL OIL: 1.0000 | TOPICAL_OIL | Status: DC | PRN

## 2021-10-24 MED ORDER — OXYCODONE HCL 5 MG PO TABS: 10.0000 mg | ORAL_TABLET | ORAL | Status: AC | PRN

## 2021-10-24 MED ORDER — MEPERIDINE HCL 25 MG/ML IJ SOLN: 6.2500 mg | INTRAMUSCULAR | Status: DC | PRN

## 2021-10-24 MED ORDER — LIDOCAINE HCL (PF) 2 % IJ SOLN
INTRAMUSCULAR | Status: DC | PRN
  Administered 2021-10-24: 40 mg via EPIDURAL
  Administered 2021-10-24: 60 mg via EPIDURAL
  Administered 2021-10-24: 100 mg via EPIDURAL

## 2021-10-24 MED ORDER — NALOXONE HCL 4 MG/10ML IJ SOLN: 1.0000 ug/kg/h | INTRAVENOUS | Status: DC | PRN

## 2021-10-24 SURGICAL SUPPLY — 33 items
CHLORAPREP W/TINT 26 (MISCELLANEOUS) ×2 IMPLANT
CLOSURE STERI STRIP 1/2 X4 (GAUZE/BANDAGES/DRESSINGS) ×1 IMPLANT
DRESSING TELFA 8X3 (GAUZE/BANDAGES/DRESSINGS) ×1 IMPLANT
DRSG TELFA 3X8 NADH (GAUZE/BANDAGES/DRESSINGS) ×2 IMPLANT
ELECT REM PT RETURN 9FT ADLT (ELECTROSURGICAL) ×2
ELECTRODE REM PT RTRN 9FT ADLT (ELECTROSURGICAL) ×1 IMPLANT
GAUZE SPONGE 4X4 12PLY STRL (GAUZE/BANDAGES/DRESSINGS) ×2 IMPLANT
GOWN STRL REUS W/ TWL LRG LVL3 (GOWN DISPOSABLE) ×3 IMPLANT
GOWN STRL REUS W/TWL LRG LVL3 (GOWN DISPOSABLE) ×6
MANIFOLD NEPTUNE II (INSTRUMENTS) ×2 IMPLANT
MAT PREVALON FULL STRYKER (MISCELLANEOUS) ×2 IMPLANT
NDL HYPO 25GX1X1/2 BEV (NEEDLE) ×1 IMPLANT
NEEDLE HYPO 25GX1X1/2 BEV (NEEDLE) ×2 IMPLANT
NS IRRIG 1000ML POUR BTL (IV SOLUTION) ×2 IMPLANT
PACK C SECTION AR (MISCELLANEOUS) ×2 IMPLANT
PAD DRESSING TELFA 3X8 NADH (GAUZE/BANDAGES/DRESSINGS) ×1 IMPLANT
PAD OB MATERNITY 4.3X12.25 (PERSONAL CARE ITEMS) ×2 IMPLANT
PAD PREP 24X41 OB/GYN DISP (PERSONAL CARE ITEMS) ×2 IMPLANT
SCRUB CHG 4% DYNA-HEX 4OZ (MISCELLANEOUS) ×2 IMPLANT
STAPLER INSORB 30 2030 C-SECTI (MISCELLANEOUS) ×1 IMPLANT
SUT MNCRL 4-0 (SUTURE) ×2
SUT MNCRL 4-0 27XMFL (SUTURE) ×1
SUT VIC AB 0 CT1 18XCR BRD 8 (SUTURE) IMPLANT
SUT VIC AB 0 CT1 36 (SUTURE) ×4 IMPLANT
SUT VIC AB 0 CT1 8-18 (SUTURE) ×4
SUT VIC AB 0 CTX 36 (SUTURE) ×4
SUT VIC AB 0 CTX36XBRD ANBCTRL (SUTURE) ×2 IMPLANT
SUT VIC AB 2-0 SH 27 (SUTURE) ×4
SUT VIC AB 2-0 SH 27XBRD (SUTURE) ×2 IMPLANT
SUT VICRYL 3-0 27IN (SUTURE) ×1 IMPLANT
SUTURE MNCRL 4-0 27XMF (SUTURE) ×1 IMPLANT
SYR 30ML LL (SYRINGE) ×4 IMPLANT
WATER STERILE IRR 500ML POUR (IV SOLUTION) ×2 IMPLANT

## 2021-10-24 NOTE — Op Note (Addendum)
  Cesarean Section Procedure Note  Date of procedure: 10/24/2021   Pre-operative Diagnosis: Intrauterine pregnancy at [redacted]w[redacted]d  - arrest of dilation at 8 cm - Cat II strip - fetal malposition, asynclitic and OP  Post-operative Diagnosis: same, delivered.  Procedure: Primary Low Transverse Cesarean Section through Pfannenstiel incision  Surgeon: BBenjaman Kindler MD  Assistant(s):  ARubie Maid MD  Given the concern for the fetal condition and need for urgent delivery, a skilled assistant was required for management of this complicated cesarean delivery.  Anesthesia: Epidural anesthesia  Anesthesiologist: KMartha Clan MD Anesthesiologist: KMartha Clan MD; ZArita Miss MD CRNA: SSherrine Maples CRNA  Estimated Blood Loss:   12067mQuantitative Blood Loss: 50080m       Drains: Foley cath         Total IV Fluids: 800m58mrine Output: see anesthesia's report         Specimens: none         Complications:  None; patient tolerated the procedure well.         Disposition: PACU - hemodynamically stable.         Condition: stable  Findings:  A female infant in OP cephalic presentation. Amniotic fluid - Meconium  Birth weight 3160 g.  Apgars of 8 and 9 at one and five minutes respectively.  Intact placenta with a three-vessel cord.  Grossly normal uterus, tubes and ovaries bilaterally. No intraabdominal adhesions were noted.  Indications: cephalo-pelvic disproportion, failure to progress: arrest of dilation, and non-reassuring fetal status  Procedure Details  The patient was taken to Operating Room, identified as the correct patient and the procedure verified as C-Section Delivery. A formal Time Out was held with all team members present and in agreement.  After induction of anesthesia, the patient was draped and prepped in the usual sterile manner. A Pfannenstiel skin incision was made and carried down through the subcutaneous tissue to the fascia. Fascial incision was  made and extended transversely with the Mayo scissors. The fascia was separated from the underlying rectus tissue superiorly and inferiorly. The peritoneum was identified and entered bluntly. Peritoneal incision was extended longitudinally. The utero-vesical peritoneal reflection was incised transversely and a bladder flap was created digitally.   A low transverse hysterotomy was made. The fetus was delivered atraumatically. The umbilical cord was clamped x2 and cut and the infant was handed to the awaiting pediatricians. The placenta was removed intact and appeared normal, intact, and with a 3-vessel cord.   The uterus was exteriorized and cleared of all clot and debris. The hysterotomy was closed with running sutures of 0-Vicryl. A second imbricating layer was placed with the same suture. Excellent hemostasis was observed. The peritoneal cavity was cleared of all clots and debris. The uterus was returned to the abdomen.   The pelvis was irrigated and again, excellent hemostasis was noted. The fascia was then reapproximated with running sutures of 0 Vicryl. The subcutaneous tissue was reapproximated with running sutures of 0 Vicryl. The skin was reapproximated with Ensorb. Bupivicaine placed in the fascial and skin lines.  Instrument, sponge, and needle counts were correct prior to the abdominal closure and at the conclusion of the case.   The patient tolerated the procedure well and was transferred to the recovery room in stable condition.   BethBenjaman Kindler 10/24/2021

## 2021-10-24 NOTE — Progress Notes (Signed)
36yo G2P0010 at 39+2wks admitted in active labor, with gDMA1 with meconium on SROM and now failure to dilate. 8cm since 2145 last night, now with minimal variability and recurrent late decels. Last accel at midnight. Called c-section urgently for fetal intolerance and arrest of dilation.   Blood sugars high 115, most recently 114.  The risks of cesarean section discussed with the patient included but were not limited to: bleeding which may require transfusion or reoperation; infection which may require antibiotics; injury to bowel, bladder, ureters or other surrounding organs; injury to the fetus; need for additional procedures including hysterectomy in the event of a life-threatening hemorrhage; placental abnormalities wth subsequent pregnancies, incisional problems, thromboembolic phenomenon and other postoperative/anesthesia complications. The patient concurred with the proposed plan, giving informed written consent for the procedure. Anesthesia and OR aware. Preoperative prophylactic antibiotics and SCDs ordered on call to the OR.  To OR when ready.

## 2021-10-24 NOTE — Progress Notes (Signed)
Called to patient room for fetal heart rate decelerations. Pitocin turned off, repositioned patient, fluid bolus given. Offered reassurance to patient and her husband that all actions are appropriate for circumstances and there is some concern for failure to dilate despite regular strong contractions. Discussed possibility of need for c/s delivery. Calls to Dr Leafy Ro who is on call, and to Dr Marcelline Mates who has been the patient's primary Ob care provider regarding my assessment and need for c/s delivery. They are en route.   Category II tracing: baseline 150, minimal to moderate variability, no accels, variable and late decels present Toco: coupling and tripling prior to d/c of pitocin followed by q 5-7 minutes  Cervix: 8/swollen especially on left/-1 Benadryl was administered 3:28 AM Tylenol administered at 4:55 AM for 99.1 temp  Rod Can, CNM

## 2021-10-24 NOTE — Transfer of Care (Signed)
Immediate Anesthesia Transfer of Care Note  Patient: Emily Wolf  Procedure(s) Performed: CESAREAN SECTION  Patient Location: Mother/Baby  Anesthesia Type:Epidural  Level of Consciousness: awake, alert  and oriented  Airway & Oxygen Therapy: Patient Spontanous Breathing  Post-op Assessment: Report given to RN and Post -op Vital signs reviewed and stable  Post vital signs: Reviewed and stable  Last Vitals:  Vitals Value Taken Time  BP    Temp    Pulse    Resp    SpO2      Last Pain:  Vitals:   10/24/21 0448  TempSrc: Oral  PainSc:          Complications: No notable events documented.

## 2021-10-25 ENCOUNTER — Encounter: Payer: Self-pay | Admitting: Obstetrics and Gynecology

## 2021-10-25 LAB — CBC
HCT: 22.5 % — ABNORMAL LOW (ref 36.0–46.0)
Hemoglobin: 7.1 g/dL — ABNORMAL LOW (ref 12.0–15.0)
MCH: 27.7 pg (ref 26.0–34.0)
MCHC: 31.6 g/dL (ref 30.0–36.0)
MCV: 87.9 fL (ref 80.0–100.0)
Platelets: 161 10*3/uL (ref 150–400)
RBC: 2.56 MIL/uL — ABNORMAL LOW (ref 3.87–5.11)
RDW: 16.3 % — ABNORMAL HIGH (ref 11.5–15.5)
WBC: 16.5 10*3/uL — ABNORMAL HIGH (ref 4.0–10.5)
nRBC: 0.1 % (ref 0.0–0.2)

## 2021-10-25 MED ORDER — OXYCODONE HCL 5 MG PO TABS
5.0000 mg | ORAL_TABLET | ORAL | Status: DC | PRN
  Administered 2021-10-25 (×2): 5 mg via ORAL
  Administered 2021-10-25: 10 mg via ORAL
  Administered 2021-10-26 (×2): 5 mg via ORAL
  Filled 2021-10-25: qty 1
  Filled 2021-10-25: qty 2
  Filled 2021-10-25 (×3): qty 1

## 2021-10-25 MED ORDER — NA FERRIC GLUC CPLX IN SUCROSE 12.5 MG/ML IV SOLN
250.0000 mg | Freq: Once | INTRAVENOUS | Status: AC
Start: 2021-10-25 — End: 2021-10-25
  Administered 2021-10-25: 250 mg via INTRAVENOUS
  Filled 2021-10-25: qty 20

## 2021-10-25 NOTE — Anesthesia Postprocedure Evaluation (Signed)
Anesthesia Post Note  Patient: Social research officer, government  Procedure(s) Performed: [place]  Patient location during evaluation: Mother Baby Anesthesia Type: Epidural Level of consciousness: awake, oriented and awake and alert Pain management: pain level controlled Vital Signs Assessment: post-procedure vital signs reviewed and stable Respiratory status: spontaneous breathing, nonlabored ventilation and respiratory function stable Cardiovascular status: blood pressure returned to baseline and stable Postop Assessment: no headache and no backache Anesthetic complications: no   No notable events documented.   Last Vitals:  Vitals:   10/24/21 1917 10/24/21 2209  BP: 123/62 134/77  Pulse: 79 95  Resp: 18 18  Temp: 37.3 C 36.9 C  SpO2: 98% 97%    Last Pain:  Vitals:   10/25/21 0606  TempSrc:   PainSc: Asleep                 Johnna Acosta

## 2021-10-25 NOTE — Lactation Note (Signed)
This note was copied from a baby's chart. Lactation Consultation Note  Patient Name: Girl Zainah Steven [id number] Date: 10/25/2021 Reason for consult: Initial assessment, RN request, breastfeeding assistance. Age:35 hours  Maternal Data This is mom's first baby, delivered by C/S for fetal intolerance of labor and arrest of dilation. Care nurse requested Mamou assistance as baby had not fed in 8 hours and has not voided(is >than 24 hours) Has patient been taught Hand Expression?: Yes Does the patient have breastfeeding experience prior to this delivery?: No  Feeding Mother's Current Feeding Choice: Breast Milk and Formula Nipple Type: Slow - flow Assisted mom with breastfeeding. Baby would latch at the right breast taking several attempts to achieve latch. Baby after latching would not suckle, mom massaging her breast to encourage baby to suckle.With parent's permission used curved tip syringe at right breast to encourage baby to suckle as she was not prompted by mom's breast massaging. Baby gaggy at the breast.Attempted for 10 minutes.  Dad assisted mom to reposition baby at the left breast in football hold. Baby latched at left breast with encouragement baby did suckle a few audible swallows noted. Baby supplemented 5 ml's at the breast. Post breastfeeding dad offered baby supplement with slow flow bottle. In additional to breastfeeding baby supplemented total of 10 ml's. Baby with large void. Initiated mom post pumping. She obtained 2 drops provided to baby.  LATCH Score Latch: Repeated attempts needed to sustain latch, nipple held in mouth throughout feeding, stimulation needed to elicit sucking reflex.  Audible Swallowing: A few with stimulation  Type of Nipple: Everted at rest and after stimulation  Comfort (Breast/Nipple): Soft / non-tender  Hold (Positioning): Assistance needed to correctly position infant at breast and maintain latch.  LATCH Score: 7   Lactation Tools  Discussed/Used Tools: Pump;Other (comment);Bottle (Curved tipped syringe) Pump Education: Setup, frequency, and cleaning;Milk Storage Reason for Pumping: Inconsistent breastfeeding at >than 24 hours of life. Pumping frequency: PRN if baby does not latch and feed. Pumped volume: 0 mL (a few drops)  Interventions Interventions: Breast feeding basics reviewed;Breast massage;Hand express;Breast compression;Adjust position;Support pillows;DEBP;Education  Consult Status Consult Status: Follow-up Date: 10/25/21 Follow-up type: In-patient  Update provided to care nurse.  Jonna Lyzette Reinhardt 10/25/2021, 12:33 PM

## 2021-10-25 NOTE — Lactation Note (Signed)
This note was copied from a baby's chart. Lactation Consultation Note  Patient Name: Emily Wolf [id number] Date: 10/25/2021 Reason for consult: Follow-up assessment Age:35 hours  Maternal Data  See initial consult from today .  To room to follow-up with mom how feeding is going. Mom with pain issues received pain medication and is asleep. Dad is attempting to bottle feed baby and reports baby is gagging and not drinking the bottle well. LC informed care nurse baby has not fed.   Feeding Mother's Current Feeding Choice: Breast Milk and Formula Nipple Type: Slow - flow  LC  assisted with bottle feeding baby with paced bottle feeding technique in side lying position.Chin support provided as every time baby sucks and receives formula into her mouth she opens her mouth wide and begins gagging and spitting out formula. With chin support baby took 11 ml's in 25 minutes. After feeding baby immediately vomited a good amount of undigested formula. Baby has had one feeding of 10 ml's of supplement at 11:15 am and this 2nd bottle.   Interventions Interventions:  (Assisted dad with paced bottle feeding. Mom with pain issues asleep and not going to offer baby to breast at this feeding.) Care nurse informed of baby's feeding and emesis.    Consult Status Consult Status: Follow-up Date: 10/26/21 Follow-up type: In-patient  Update provided to care nurse. Transitional care nurse to room to assess baby and feeding.  Emily Wolf 10/25/2021, 4:23 PM

## 2021-10-25 NOTE — Progress Notes (Signed)
Progress Note - Cesarean Delivery  Emily Wolf is a 36 y.o. [id number] now PP day 1 s/p C-Section, Low Transverse .   Subjective:  Patient reports no problems with eating, voiding, or her wound    Objective:  Vital signs in last 24 hours: Temp:  [98 F (36.7 C)-99.2 F (37.3 C)] 98.6 F (37 C) (07/24 0844) Pulse Rate:  [77-95] 84 (07/24 0844) Resp:  [15-20] 20 (07/24 0844) BP: (102-134)/(62-77) 111/68 (07/24 0844) SpO2:  [93 %-99 %] 96 % (07/24 0844)  Physical Exam:  General: alert, cooperative, appears stated age, and fatigued Lochia: appropriate Uterine Fundus: firm Incision: healing well, no significant drainage    Data Review Recent Labs    10/23/21 1157 10/25/21 0701  HGB 13.1 7.1*  HCT 42.2 22.5*    Assessment:  Principal Problem:   Indication for care in labor or delivery Active Problems:   Labor and delivery, indication for care   Status post Cesarean section. Doing well postoperatively.   Anemia  Plan:       Continue current care.  Iron transfusion ordered.   Philip Aspen, CNM  10/25/2021 9:41 AM

## 2021-10-26 ENCOUNTER — Other Ambulatory Visit: Payer: PRIVATE HEALTH INSURANCE

## 2021-10-26 NOTE — Progress Notes (Signed)
Subjective: Postpartum Day 2: Cesarean Delivery Patient reports incisional pain, tolerating PO, + flatus, and no problems voiding.  Her baby is in the NICU and she is teary at the moment.  Objective: Vital signs in last 24 hours: Temp:  [97.6 F (36.4 C)-98.7 F (37.1 C)] 98.2 F (36.8 C) (07/25 0744) Pulse Rate:  [84-107] 84 (07/25 0744) Resp:  [17-20] 20 (07/25 0744) BP: (110-117)/(60-71) 110/60 (07/25 0744) SpO2:  [96 %-97 %] 97 % (07/25 0744)  Physical Exam:  General: cooperative, fatigued, mild distress, and mildly obese Lochia: appropriate Uterine Fundus: firm Incision: healing well, no significant drainage DVT Evaluation: No evidence of DVT seen on physical exam. Negative Homan's sign.  Recent Labs    10/23/21 1157 10/25/21 0701  HGB 13.1 7.1*  HCT 42.2 22.5*    Assessment/Plan: Status post Cesarean section. Doing well postoperatively.  Continue current care Anticipate discharge tomorrow. Will discuss with NICU re how long her baby will remain under their care.  Imagene Riches, CNM 10/26/2021, 10:51 AM

## 2021-10-27 LAB — CBC
HCT: 23.6 % — ABNORMAL LOW (ref 36.0–46.0)
Hemoglobin: 7.2 g/dL — ABNORMAL LOW (ref 12.0–15.0)
MCH: 27.2 pg (ref 26.0–34.0)
MCHC: 30.5 g/dL (ref 30.0–36.0)
MCV: 89.1 fL (ref 80.0–100.0)
Platelets: 230 10*3/uL (ref 150–400)
RBC: 2.65 MIL/uL — ABNORMAL LOW (ref 3.87–5.11)
RDW: 16.9 % — ABNORMAL HIGH (ref 11.5–15.5)
WBC: 10.9 10*3/uL — ABNORMAL HIGH (ref 4.0–10.5)
nRBC: 0.5 % — ABNORMAL HIGH (ref 0.0–0.2)

## 2021-10-27 MED ORDER — OXYCODONE HCL 5 MG PO TABS: 5.0000 mg | ORAL_TABLET | ORAL | 0 refills | Status: AC | PRN

## 2021-10-27 MED ORDER — FERROUS SULFATE 325 (65 FE) MG PO TABS
325.0000 mg | ORAL_TABLET | Freq: Two times a day (BID) | ORAL | 3 refills | Status: DC
Start: 2021-10-27 — End: 2022-06-02

## 2021-10-27 MED ORDER — GABAPENTIN 300 MG PO CAPS: 300.0000 mg | ORAL_CAPSULE | Freq: Every day | ORAL | 1 refills | Status: DC

## 2021-10-27 MED ORDER — ACETAMINOPHEN 500 MG PO TABS: 1000.0000 mg | ORAL_TABLET | Freq: Four times a day (QID) | ORAL | 0 refills | Status: DC

## 2021-10-27 NOTE — Progress Notes (Signed)
Patient discharged home. Patient staying in room a little longer. Infant will stay in SCN.  Discharge instructions and prescriptions given and reviewed with patient. Patient verbalized understanding.   Follow-up appointments scheduled for 2-weeks and 6-weeks.   Explained accessibility to SCN is 24 hours.   Will be escorted out by volunteers.

## 2021-10-27 NOTE — Discharge Summary (Signed)
Obstetrical Discharge Summary  Date of Admission: 10/23/2021 Date of Discharge: 10/27/2021  Primary OB: [facility]  Gestational Age at Delivery: [redacted]w[redacted]d  Antepartum complications: gestational diabetes Reason for Admission: labor Date of Delivery: 10/24/2021  Delivered By: Dr BLeafy Roand Dr CMarcelline Mates Delivery Type: primary cesarean section, low transverse incision Intrapartum complications/course: Failure to Progress Anesthesia: epidural Placenta: Delivered and expressed via active management. Intact: yes. To pathology: no.  Laceration: LTCS Episiotomy: none EBL: 1200 Baby: Liveborn female, APGARs 8/9, weight 3160 g.    Discharge Diagnosis: Delivered.   Postpartum course: Uneventful PP course. Is ambulating to SCN without difficulty. Voiding and stooling without difficulty. Pain well managed with oxycodone. Is breastfeeding, infant currently in SCN and has not gone to breast since birth, she did initially latch but now seems to have  lost interest. Pt is pumping and getting a measurable amount. Pt a little tearful, but ok. Will have little support once home, her partner needs to to return to work and other family members will not be available during daytime hours.  Her mother will come once infant is discharged. She received and Iron transfusion on 7/24.   Discharge Vital Signs:  Current Vital Signs 24h Vital Sign Ranges  T 98.7 F (37.1 C) Temp  Avg: 98.6 F (37 C)  Min: 98.3 F (36.8 C)  Max: 98.7 F (37.1 C)  BP 110/69 BP  Min: 110/69  Max: 130/67  HR 89 Pulse  Avg: 96.3  Min: 89  Max: 100  RR 20 Resp  Avg: 18.7  Min: 18  Max: 20  SaO2 100 % Room Air SpO2  Avg: 100 %  Min: 100 %  Max: 100 %       24 Hour I/O Current Shift I/O  Time Ins Outs No intake/output data recorded. No intake/output data recorded.   Discharge Exam:  NAD RRR no MRGs LUNGS: CTAB Breasts: soft, no discoloration, nipples erect and intact bilaterally  Abdomen: firm fundus below the umbilicus, NTTP, non  distended, +bowel sounds.  Incision c/d/i with honeycomb dressing  Ext: trace BLE, negative homan's sign   Recent Labs  Lab 10/23/21 1157 10/25/21 0701 10/27/21 1143  WBC 7.6 16.5* 10.9*  HGB 13.1 7.1* 7.2*  HCT 42.2 22.5* 23.6*  PLT 230 161 230    Disposition: Home  Rh Immune globulin given: no Rubella vaccine given: no Tdap vaccine given in AP or PP setting: yes Flu vaccine given in AP or PP setting: no  Contraception:  Depo or pills   Prenatal/Postnatal Panel: A POS//Rubella Immune//Varicella Immune//RPR negative//HIV negative/HepB Surface Ag negative//pap no abnormalities (date: 2022 at [facility])//plans to breastfeed  Plan:  TDaila Elbertwas discharged to home in good condition. Follow-up appointment with Dr CMarcelline Mates in 2 weeks for a incision check then in 6 weeks for PP  visit  No future appointments.  Discharge Medications: Allergies as of 10/27/2021       Reactions   Aspirin Shortness Of Breath   Ibuprofen Shortness Of Breath   Nsaids Shortness Of Breath   Augmentin [amoxicillin-pot Clavulanate] Hives        Medication List     STOP taking these medications    blood glucose meter kit and supplies Kit   BUDESONIDE NA       TAKE these medications    acetaminophen 500 MG tablet Commonly known as: TYLENOL Take 2 tablets (1,000 mg total) by mouth every 6 (six) hours.   albuterol 108 (90 Base) MCG/ACT inhaler Commonly known  as: VENTOLIN HFA Inhale 2 puffs into the lungs every 6 (six) hours as needed for wheezing or shortness of breath.   ferrous sulfate 325 (65 FE) MG tablet Take 1 tablet (325 mg total) by mouth 2 (two) times daily with a meal.   Fluticasone-Salmeterol 250-50 MCG/DOSE Aepb Commonly known as: ADVAIR Inhale 1 puff into the lungs 2 (two) times daily.   gabapentin 300 MG capsule Commonly known as: NEURONTIN Take 1 capsule (300 mg total) by mouth at bedtime.   oxyCODONE 5 MG immediate release tablet Commonly known as: Oxy  IR/ROXICODONE Take 1 tablet (5 mg total) by mouth every 4 (four) hours as needed for up to 3 days for moderate pain or severe pain.   oxymetazoline 0.05 % nasal spray Commonly known as: AFRIN Place 1 spray into both nostrils 2 (two) times daily as needed for congestion.   PRENATAL PO Take by mouth.               Discharge Care Instructions  (From admission, onward)           Start     Ordered   10/27/21 0000  Discharge wound care:       Comments: SHOWER DAILY Wash incision gently with soap and water.  Call office with any drainage, redness, or firmness of the incision.   10/27/21 [place], CNM  [facility]  '@TODAY' @  12:18 PM

## 2021-10-27 NOTE — Discharge Instructions (Addendum)
Discharge Instructions:   2-week incision check: Tuesday, 8/8 at 2:45pm with Dr. Marcelline Mates at [facility] 6-week postpartum follow-up: Wednesday, 9/6 at 9:30am with Dr. Marcelline Mates at [facility]   If there are any new medications, they have been ordered and will be available for pickup at the listed pharmacy on your way home from the hospital.   Call office if you have any of the following: headache, visual changes, fever >101.0 F, chills, shortness of breath, breast concerns, excessive vaginal bleeding, incision drainage or problems, leg pain or redness, depression or any other concerns. If you have vaginal discharge with an odor, let your doctor know.   It is normal to bleed for up to 6 weeks. You should not soak through more than 1 pad in 1 hour. If you have a blood clot larger than your fist with continued bleeding, call your doctor.   After a c-section, you should expect a small amount of blood or clear fluid coming from the incision and abdominal cramping/soreness. Inspect your incision site daily. Stand in front of a mirror to look for any redness, incision opening, or discolored/odorness drainage. Take a shower daily and continue good hygiene. Use own towel and washcloth (do not share). Make sure your sheets on your bed are clean. No pets sleeping around your incision site. Dressing will be removed at your postpartum visit. If the dressing does become wet or soiled underneath, it is okay to remove it.   Activity: Do not lift > 10 lbs for 6 weeks (do not lift anything heavier than your baby). No intercourse, tampons, swimming pools, hot tubs, baths (only showers) for 6 weeks.  No driving for 1-2 weeks. Continue prenatal vitamin, especially if breastfeeding. Increase calories and fluids (water) while breastfeeding.   Your milk will come in, in the next couple of days (right now it is colostrum). You may have a slight fever when your milk comes in, but it should go away on its  own.  If it does not, and rises above 101 F please call the doctor. You will also feel achy and your breasts will be firm. They will also start to leak. If you are breastfeeding, continue as you have been and you can pump/express milk for comfort.   If you have too much milk, your breasts can become engorged, which could lead to mastitis. This is an infection of the milk ducts. It can be very painful and you will need to notify your doctor to obtain a prescription for antibiotics. You can also treat it with a shower or hot/cold compress.   For concerns about your baby, please call your pediatrician.  For breastfeeding concerns, the lactation consultant can be reached at [phone].   Postpartum blues (feelings of happy one minute and sad another minute) are normal for the first few weeks but if it gets worse let your doctor know.   Congratulations! We enjoyed caring for you and your new bundle of joy!

## 2021-10-29 ENCOUNTER — Ambulatory Visit: Payer: Self-pay

## 2021-10-29 NOTE — Lactation Note (Signed)
This note was copied from a baby's chart. Lactation Consultation Note  Patient Name: Girl Hayven Croy [id number] Date: 10/29/2021 Reason for consult: Follow-up assessment;Primapara;NICU baby;Exclusive pumping and bottle feeding;Term Age:35 days  Maternal Data Does the patient have breastfeeding experience prior to this delivery?: No  Feeding Mother's Current Feeding Choice: Breast Milk and Formula Mom at bedside skin to skin with baby while baby being tube fed, mom states she is pumping her breasts approx q 3h and getting 15-20 cc EBM each session, she uses a Motif pump at home, recommend to try to pump more frequently, q2h, when possible, use symphony pump at baby's bedside whenever possible, lactation will follow up at the first of next week.   LATCH Score                    Lactation Tools Discussed/Used Breast pump type: Double-Electric Breast Pump;Other (comment) (uses DEBP here and Motif breast pump at home) Pumping frequency: q3hr (when possible) Pumped volume: -5 mL  Interventions    Discharge Pump: DEBP;Personal (motif breast pump)  Consult Status Consult Status: Follow-up Date: 11/01/21 Follow-up type: In-patient    Ferol Luz 10/29/2021, 4:05 PM

## 2021-11-09 ENCOUNTER — Ambulatory Visit (INDEPENDENT_AMBULATORY_CARE_PROVIDER_SITE_OTHER): Payer: PRIVATE HEALTH INSURANCE | Admitting: Obstetrics and Gynecology

## 2021-11-09 ENCOUNTER — Encounter: Payer: Self-pay | Admitting: Obstetrics and Gynecology

## 2021-11-09 VITALS — BP 110/75 | HR 83 | Ht <= 58 in | Wt 139.9 lb

## 2021-11-09 DIAGNOSIS — Z09 Encounter for follow-up examination after completed treatment for conditions other than malignant neoplasm: Secondary | ICD-10-CM

## 2021-11-09 DIAGNOSIS — Z1332 Encounter for screening for maternal depression: Secondary | ICD-10-CM

## 2021-11-09 DIAGNOSIS — Z98891 History of uterine scar from previous surgery: Secondary | ICD-10-CM

## 2021-11-09 NOTE — Progress Notes (Signed)
      History of Present Illness:   Emily Wolf is a 36 y.o. G61P1011 female who presents for a 2 week for incision check and mood check. She is 2 weeks postpartum following a primary cesarean delivery for failure to progress and NRFHT.  The delivery was at 9 gestational weeks.  Postpartum course has been well so far.  Baby's course has been complicated by low/slow feeds of unknown cause, infant currently in NICU at [facility]. Baby is feeding by breast (pumping, donor milk, may incorporate formula). Bleeding: light. Postpartum depression screening: negative.  EDPS score is 7.  Reports that she is having minimal pain, using Ibuprofen for pain as needed.      The following portions of the patient's history were reviewed and updated as appropriate: allergies, current medications, past family history, past medical history, past social history, past surgical history, and problem list.   Observations/Objective:   Blood pressure 110/75, pulse 83, height '4\' 8"'$  (1.422 m), weight 139 lb 14.4 oz (63.5 kg), unknown if currently breastfeeding. Gen App: NAD Abd: incision with Honeycomb dressing still partially adherent.  Removed, with well-healing incision, well-approximated, no drainage or erythema.  Pelvis: not examined. Psych: normal speech, affect. Good mood.        10/24/2021   11:30 PM  Edinburgh Postnatal Depression Scale Screening Tool  I have been able to laugh and see the funny side of things. 0  I have looked forward with enjoyment to things. 0  I have blamed myself unnecessarily when things went wrong. 1  I have been anxious or worried for no good reason. 2  I have felt scared or panicky for no good reason. 2  Things have been getting on top of me. 1  I have been so unhappy that I have had difficulty sleeping. 0  I have felt sad or miserable. 1  I have been so unhappy that I have been crying. 0  The thought of harming myself has occurred to me. 0  Edinburgh Postnatal Depression Scale  Total 7       Assessment and Plan:   1. Post-op check s/p C-section - Overall doing well. Continue routine postpartum care.  - Discussed continued home wound care  2. Encounter for screening for maternal depression - Screening negative today. Will rescreen at 6 week postpartum visit. Overall doing well.    3. Lactating mother - Reports slightly decreased milk supply, is increasing water intake, following a special diet, also taking a lactational supplement that [facility] recommended. Also discussed use of [place], brewer's yeast, oatmeal products, and if all else fails, can prescribe Reglan.    Follow Up Instructions:     I discussed the assessment and treatment plan with the patient. The patient was provided an opportunity to ask questions and all were answered. The patient agreed with the plan and demonstrated an understanding of the instructions.   The patient was advised to call back or seek an in-person evaluation if the symptoms worsen or if the condition fails to improve as anticipated.      Rubie Maid, MD [facility]

## 2021-11-24 ENCOUNTER — Encounter: Payer: Self-pay | Admitting: Obstetrics and Gynecology

## 2021-12-03 NOTE — Progress Notes (Unsigned)
   OBSTETRICS POSTPARTUM CLINIC PROGRESS NOTE  Subjective:     Emily Wolf is a 36 y.o. G80P1011 female who presents for a postpartum visit. She is 6 week postpartum following a low transvere transverse Cesarean section. I have fully reviewed the prenatal and intrapartum course. The delivery was at 39.1 gestational weeks.  Anesthesia: epidural. Postpartum course has she has felt agitated and irritated. Baby's course has been well since release from hospital on 08/25. Baby is feeding by bottle - Enfamil Gentle Ease . Bleeding: patient has resumed menses, with Patient's last menstrual period was 11/25/2021. Bowel function is normal. Bladder function is normal. Patient is not sexually active. Contraception method desired is Depo-Provera injections. Postpartum depression screening: positive.  EDPS score is 11.    The following portions of the patient's history were reviewed and updated as appropriate: allergies, current medications, past family history, past medical history, past social history, past surgical history, and problem list.  Review of Systems Pertinent items noted in HPI and remainder of comprehensive ROS otherwise negative.   Objective:    BP 116/67   Pulse 79   Resp 16   Ht '4\' 8"'$  (1.422 m)   Wt 131 lb 14.4 oz (59.8 kg)   LMP 11/25/2021   BMI 29.57 kg/m   General:  alert and no distress   Breasts:  inspection negative, no nipple discharge or bleeding, no masses or nodularity palpable  Lungs: clear to auscultation bilaterally  Heart:  regular rate and rhythm, S1, S2 normal, no murmur, click, rub or gallop  Abdomen: soft, non-tender; bowel sounds normal; no masses,  no organomegaly.  Well healed Pfannenstiel incision   Vulva:  normal  Vagina: normal vagina, no discharge, exudate, lesion, or erythema  Cervix:  no cervical motion tenderness and no lesions  Corpus: normal size, contour, position, consistency, mobility, non-tender  Adnexa:  normal adnexa and no mass, fullness,  tenderness  Rectal Exam: Not performed.         Labs:  Lab Results  Component Value Date   HGB 7.2 (L) 10/27/2021    Assessment:   1. Postpartum care following cesarean delivery   2. History of gestational diabetes   3. Postpartum anemia   4. Postpartum depression      Plan:   1. Contraception: Depo-Provera injections. Injection given today.  2. Will check Hgb for h/o postpartum anemia of less than 10.  3. History of GDM, HgbA1c ordered today.  4. Discussion had with patient today regarding symptoms.  Likely suffering from postpartum depression.  Currently no SI/HI.  Patient notes that she has good support at home.  Advised on getting as much rest as possible and utilizing her support system as sleep deprivation can also intensify symptoms. Discussed options for management, including counseling and/or medications, or both.  Also discussed OTC natural supplements to boost mood. Patient desires medication and counseling for now.  Will prescribe Lexapro 5 mg and refer to counseling.  5. Follow up in: 1 months for mood check. Follow up sooner as needed.    Rubie Maid, MD [facility]

## 2021-12-07 ENCOUNTER — Ambulatory Visit (INDEPENDENT_AMBULATORY_CARE_PROVIDER_SITE_OTHER): Payer: PRIVATE HEALTH INSURANCE | Admitting: Obstetrics and Gynecology

## 2021-12-07 ENCOUNTER — Encounter: Payer: Self-pay | Admitting: Obstetrics and Gynecology

## 2021-12-07 DIAGNOSIS — O9081 Anemia of the puerperium: Secondary | ICD-10-CM | POA: Diagnosis not present

## 2021-12-07 DIAGNOSIS — F53 Postpartum depression: Secondary | ICD-10-CM

## 2021-12-07 DIAGNOSIS — Z8632 Personal history of gestational diabetes: Secondary | ICD-10-CM | POA: Diagnosis not present

## 2021-12-07 DIAGNOSIS — Z30013 Encounter for initial prescription of injectable contraceptive: Secondary | ICD-10-CM | POA: Diagnosis not present

## 2021-12-07 MED ORDER — MEDROXYPROGESTERONE ACETATE 150 MG/ML IM SUSP
150.0000 mg | Freq: Once | INTRAMUSCULAR | Status: AC
  Administered 2021-12-07: 150 mg via INTRAMUSCULAR

## 2021-12-07 MED ORDER — ESCITALOPRAM OXALATE 5 MG PO TABS: 5.0000 mg | ORAL_TABLET | Freq: Every day | ORAL | 0 refills | Status: DC

## 2021-12-07 NOTE — Progress Notes (Unsigned)
Patient presents for postpartum visit.   Emily Wolf L, CMA

## 2021-12-07 NOTE — Progress Notes (Deleted)
   OBSTETRICS POSTPARTUM CLINIC PROGRESS NOTE  Subjective:     Emily Wolf is a 36 y.o. G70P1011 female who presents for a postpartum visit. She is 6 weeks postpartum following a low cervical transverse Cesarean section. I have fully reviewed the prenatal and intrapartum course. The delivery was at 39.1 gestational weeks.  Anesthesia: epidural. Postpartum course has been ***. Baby's course has been ***. Baby is feeding by {breast/bottle:69}. Bleeding: patient {HAS HAS LOV:56433} not resumed menses, with No LMP recorded.. Bowel function is {normal:32111}. Bladder function is {normal:32111}. Patient {is/is not:9024} sexually active. Contraception method desired is {contraceptive method:5051}. Postpartum depression screening: {neg default:13464::"negative"}.  EDPS score is ***.    The following portions of the patient's history were reviewed and updated as appropriate: allergies, current medications, past family history, past medical history, past social history, past surgical history, and problem list.  Review of Systems {ros; complete:30496}   Objective:    There were no vitals taken for this visit.  General:  alert and no distress   Breasts:  inspection negative, no nipple discharge or bleeding, no masses or nodularity palpable  Lungs: clear to auscultation bilaterally  Heart:  regular rate and rhythm, S1, S2 normal, no murmur, click, rub or gallop  Abdomen: soft, non-tender; bowel sounds normal; no masses,  no organomegaly.  ***Well healed Pfannenstiel incision   Vulva:  normal  Vagina: normal vagina, no discharge, exudate, lesion, or erythema  Cervix:  no cervical motion tenderness and no lesions  Corpus: normal size, contour, position, consistency, mobility, non-tender  Adnexa:  normal adnexa and no mass, fullness, tenderness  Rectal Exam: Not performed.         Labs:  Lab Results  Component Value Date   HGB 7.2 (L) 10/27/2021     Assessment:   No diagnosis found.    Plan:    1. Contraception: {method:5051} 2. Will check Hgb for h/o postpartum anemia of less than 10.  3. Follow up in:  4-6  months or as needed.    Rubie Maid, MD [facility]

## 2021-12-07 NOTE — Patient Instructions (Signed)

## 2021-12-08 ENCOUNTER — Encounter: Payer: PRIVATE HEALTH INSURANCE | Admitting: Obstetrics and Gynecology

## 2021-12-08 LAB — HEMOGLOBIN AND HEMATOCRIT, BLOOD
Hematocrit: 39.5 % (ref 34.0–46.6)
Hemoglobin: 12.4 g/dL (ref 11.1–15.9)

## 2021-12-08 LAB — HEMOGLOBIN A1C
Est. average glucose Bld gHb Est-mCnc: 100 mg/dL
Hgb A1c MFr Bld: 5.1 % (ref 4.8–5.6)

## 2021-12-29 ENCOUNTER — Encounter: Payer: Self-pay | Admitting: Obstetrics and Gynecology

## 2022-01-04 ENCOUNTER — Telehealth (INDEPENDENT_AMBULATORY_CARE_PROVIDER_SITE_OTHER): Payer: 59 | Admitting: Obstetrics and Gynecology

## 2022-01-04 ENCOUNTER — Encounter: Payer: Self-pay | Admitting: Obstetrics and Gynecology

## 2022-01-04 VITALS — Ht <= 58 in

## 2022-01-04 DIAGNOSIS — R454 Irritability and anger: Secondary | ICD-10-CM

## 2022-01-04 DIAGNOSIS — F53 Postpartum depression: Secondary | ICD-10-CM | POA: Diagnosis not present

## 2022-01-04 DIAGNOSIS — Z8632 Personal history of gestational diabetes: Secondary | ICD-10-CM

## 2022-01-04 HISTORY — DX: Personal history of gestational diabetes: Z86.32

## 2022-01-04 NOTE — Progress Notes (Signed)
Virtual Visit via Telephone Note I connected with  Emily Wolf on 01/04/22 by a telephone enabled telemedicine application and verified that I am speaking with the correct person using two identifiers.   Location: Patient: Writer: Office   I discussed the limitations, risks, security and privacy concerns of performing an evaluation and management service by telephone and the availability of in person appointments. I also discussed with the patient that there may be a patient responsible charge related to this service. The patient expressed understanding and agreed to proceed.   GYNECOLOGY PROGRESS NOTE  Subjective:    Patient ID: Emily Wolf, female    DOB: [birth year], 36 y.o.   MRN: [id number]  HPI  Patient is a 36 y.o. G43P1011 female who presents for mood check. She was prescribed Lexapro 5 mg last month for irritability and symptoms of mild postpartum depression.  Notes that she had to increase her dose after ~ 2 weeks but feels more stable now. Did note missing several days, and noticed that her irritability returned during that time.    The following portions of the patient's history were reviewed and updated as appropriate: allergies, current medications, past family history, past medical history, past social history, past surgical history, and problem list.  Review of Systems Pertinent items noted in HPI and remainder of comprehensive ROS otherwise negative.   Objective:   Height '4\' 8"'$  (1.422 m), last menstrual period 11/25/2021, not currently breastfeeding.  Body mass index is 29.57 kg/m. General appearance: sounds alert and no distress     01/04/2022    3:42 PM 12/07/2021    1:37 PM 11/09/2021    2:56 PM 10/24/2021   11:30 PM  Edinburgh Postnatal Depression Scale Screening Tool  I have been able to laugh and see the funny side of things. 1 1 0 0  I have looked forward with enjoyment to things. 1 0 1 0  I have blamed myself unnecessarily when things went  wrong. '1 2 1 1  '$ I have been anxious or worried for no good reason. 2 2 0 2  I have felt scared or panicky for no good reason. 2 2 0 2  Things have been getting on top of me. '1 1 1 1  '$ I have been so unhappy that I have had difficulty sleeping. 0 1 1 0  I have felt sad or miserable. '1 1 1 1  '$ I have been so unhappy that I have been crying. 0 1 1 0  The thought of harming myself has occurred to me. 0 0 0 0  Edinburgh Postnatal Depression Scale Total '9 11 6 7     '$ Assessment:   1. Irritability   2. Postpartum depression      Plan:   Patient notes symptoms improving with Lexapro. Advised to continue at this time for at least 3-6 months, and then can reassess symptoms. Cautioned on abrupt cessation of medication, counseled on weaning process if she desires to discontinue before this time.   Will send new prescription in for increased dose of medication (10 mg).  Return to clinic for any scheduled appointments or for any gynecologic concerns as needed.      I discussed the assessment and treatment plan with the patient. The patient was provided an opportunity to ask questions and all were answered. The patient agreed with the plan and demonstrated an understanding of the instructions.   The patient was advised to call back or seek an in-person evaluation if the  symptoms worsen or if the condition fails to improve as anticipated.  I provided 8 minutes of non-face-to-face time during this encounter.   Rubie Maid, MD [facility]

## 2022-01-12 ENCOUNTER — Encounter: Payer: Self-pay | Admitting: Obstetrics and Gynecology

## 2022-01-13 ENCOUNTER — Encounter: Payer: Self-pay | Admitting: Obstetrics and Gynecology

## 2022-01-22 ENCOUNTER — Other Ambulatory Visit: Payer: Self-pay | Admitting: Obstetrics and Gynecology

## 2022-01-24 MED ORDER — ESCITALOPRAM OXALATE 10 MG PO TABS: 10.0000 mg | ORAL_TABLET | Freq: Every day | ORAL | 1 refills | Status: AC

## 2022-01-24 NOTE — Telephone Encounter (Signed)
Dose of Lexapro increased to 10 mg per patient request.    Dr. Marcelline Mates

## 2022-02-25 ENCOUNTER — Ambulatory Visit: Payer: PRIVATE HEALTH INSURANCE

## 2022-03-04 ENCOUNTER — Ambulatory Visit (INDEPENDENT_AMBULATORY_CARE_PROVIDER_SITE_OTHER): Payer: Self-pay

## 2022-03-04 VITALS — BP 123/86 | HR 87 | Resp 16 | Ht <= 58 in | Wt 135.3 lb

## 2022-03-04 DIAGNOSIS — Z3042 Encounter for surveillance of injectable contraceptive: Secondary | ICD-10-CM

## 2022-03-04 MED ORDER — MEDROXYPROGESTERONE ACETATE 150 MG/ML IM SUSP
150.0000 mg | Freq: Once | INTRAMUSCULAR | Status: AC
  Administered 2022-03-04: 150 mg via INTRAMUSCULAR

## 2022-03-04 NOTE — Patient Instructions (Signed)

## 2022-03-04 NOTE — Progress Notes (Addendum)
    Nurse Visit Note  Subjective:    Patient ID: Emily Wolf, female    DOB: [birth year], 36 y.o.   MRN: [id number]  HPI  Patient is a 36 y.o. G65P1011 female who presents for surveillance of depo provera injection.  Date last pap: 06/26/2019. Last Depo-Provera: 12/07/2021. Side Effects if any: None. Serum HCG indicated? N/A. Depo-Provera 150 mg IM given by: Cristy Folks, CMA. Next appointment due: Feb. 16 - March 2    The following portions of the patient's history were reviewed and updated as appropriate: allergies, current medications, past family history, past medical history, past social history, past surgical history, and problem list.  Review of Systems Pertinent items are noted in HPI.   Objective:  Body mass index is 29.54 kg/m. Blood pressure 123/86, pulse 87, resp. rate 16, height 4' 8.75" (1.441 m), weight 135 lb 4.8 oz (61.4 kg), not currently breastfeeding.   General appearance: alert, cooperative, and no distress  Assessment:   1. Surveillance for Depo-Provera contraception      Plan:   There are no diagnoses linked to this encounter.     Cristy Folks, [place]

## 2022-05-24 ENCOUNTER — Ambulatory Visit: Payer: 59

## 2022-06-02 ENCOUNTER — Ambulatory Visit (INDEPENDENT_AMBULATORY_CARE_PROVIDER_SITE_OTHER): Payer: Self-pay

## 2022-06-02 VITALS — Ht <= 58 in | Wt 143.0 lb

## 2022-06-02 DIAGNOSIS — Z3042 Encounter for surveillance of injectable contraceptive: Secondary | ICD-10-CM

## 2022-06-02 MED ORDER — MEDROXYPROGESTERONE ACETATE 150 MG/ML IM SUSP: 150.0000 mg | INTRAMUSCULAR | 0 refills | Status: DC

## 2022-06-02 MED ORDER — MEDROXYPROGESTERONE ACETATE 150 MG/ML IM SUSY
150.0000 mg | PREFILLED_SYRINGE | Freq: Once | INTRAMUSCULAR | Status: AC
  Administered 2022-06-02: 150 mg via INTRAMUSCULAR

## 2022-06-02 NOTE — Progress Notes (Signed)
    NURSE VISIT NOTE  Subjective:    Patient ID: Emily Wolf, female    DOB: [birth year], 37 y.o.   MRN: [id number]  HPI  Patient is a 37 y.o. G82P1011 female who presents for depo provera injection.   Objective:    Ht 4' 8.75" (1.441 m)   Wt 143 lb (64.9 kg)   Breastfeeding No   BMI 31.22 kg/m   Last Annual: 12/07/2021. Last pap: 3. Last Depo-Provera: 03/04/2022. Side Effects if any: none. Serum HCG indicated? No . Depo-Provera 150 mg IM given by: Drenda Freeze, CMA. Site: Left Deltoid  Lab Review  @THIS$  VISIT ONLY@  Assessment:   1. Encounter for surveillance of injectable contraceptive      Plan:   Next appointment due between May 17 and May 31.    Drenda Freeze, CMA

## 2022-06-02 NOTE — Patient Instructions (Signed)

## 2022-08-24 ENCOUNTER — Ambulatory Visit (INDEPENDENT_AMBULATORY_CARE_PROVIDER_SITE_OTHER): Payer: 59

## 2022-08-24 VITALS — BP 121/81 | HR 92 | Ht <= 58 in | Wt 147.0 lb

## 2022-08-24 DIAGNOSIS — Z3042 Encounter for surveillance of injectable contraceptive: Secondary | ICD-10-CM | POA: Diagnosis not present

## 2022-08-24 MED ORDER — MEDROXYPROGESTERONE ACETATE 150 MG/ML IM SUSY
150.0000 mg | PREFILLED_SYRINGE | Freq: Once | INTRAMUSCULAR | Status: AC
  Administered 2022-08-24: 150 mg via INTRAMUSCULAR

## 2022-08-24 NOTE — Progress Notes (Signed)
    NURSE VISIT NOTE  Subjective:    Patient ID: Emily Wolf, female    DOB: 1985-05-10, 37 y.o.   MRN: [id number]  HPI  Patient is a 37 y.o. G12P1011 female who presents for depo provera injection.   Objective:    BP 121/81   Pulse 92   Ht 4\' 9"  (1.448 m)   Wt 147 lb (66.7 kg)   LMP  (LMP Unknown)   Breastfeeding No   BMI 31.81 kg/m   Last Annual: 12/07/2021. Marland Kitchen Last pap: N/A. Last Depo-Provera: 06/02/2022. Side Effects if any: none. Serum HCG indicated? No . Depo-Provera 150 mg IM given by: Beverely Pace, CMA. Site: Right Deltoid    Assessment:   1. Surveillance for Depo-Provera contraception      Plan:   Next appointment due between AUG 8-21     Loney Laurence, CMA
